# Patient Record
Sex: Female | Born: 1960 | Race: Black or African American | Hispanic: No | Marital: Married | Smoking: Never smoker
Health system: Southern US, Community
[De-identification: ages and names within clinical notes are randomized; demographics above are authoritative.]

## PROBLEM LIST (undated history)

## (undated) DIAGNOSIS — E785 Hyperlipidemia, unspecified: Secondary | ICD-10-CM

## (undated) DIAGNOSIS — I10 Essential (primary) hypertension: Secondary | ICD-10-CM

## (undated) DIAGNOSIS — J189 Pneumonia, unspecified organism: Secondary | ICD-10-CM

## (undated) DIAGNOSIS — Z87442 Personal history of urinary calculi: Secondary | ICD-10-CM

## (undated) HISTORY — DX: Hyperlipidemia, unspecified: E78.5

## (undated) HISTORY — DX: Essential (primary) hypertension: I10

---
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
## 2003-03-30 ENCOUNTER — Emergency Department (HOSPITAL_COMMUNITY): Admission: EM | Admit: 2003-03-30 | Discharge: 2003-03-30 | Payer: Self-pay | Admitting: Emergency Medicine

## 2003-09-18 ENCOUNTER — Encounter: Admission: RE | Admit: 2003-09-18 | Discharge: 2003-09-18 | Payer: Self-pay | Admitting: Internal Medicine

## 2003-10-07 ENCOUNTER — Encounter: Admission: RE | Admit: 2003-10-07 | Discharge: 2003-10-07 | Payer: Self-pay | Admitting: Internal Medicine

## 2004-05-02 ENCOUNTER — Encounter: Admission: RE | Admit: 2004-05-02 | Discharge: 2004-05-02 | Payer: Self-pay | Admitting: Internal Medicine

## 2005-10-25 ENCOUNTER — Encounter: Admission: RE | Admit: 2005-10-25 | Discharge: 2005-10-25 | Payer: Self-pay | Admitting: Internal Medicine

## 2008-03-23 ENCOUNTER — Encounter: Admission: RE | Admit: 2008-03-23 | Discharge: 2008-03-23 | Payer: Self-pay | Admitting: Internal Medicine

## 2008-11-04 ENCOUNTER — Encounter: Admission: RE | Admit: 2008-11-04 | Discharge: 2008-11-04 | Payer: Self-pay | Admitting: Internal Medicine

## 2009-03-30 ENCOUNTER — Encounter: Admission: RE | Admit: 2009-03-30 | Discharge: 2009-03-30 | Payer: Self-pay | Admitting: Internal Medicine

## 2010-03-31 ENCOUNTER — Encounter
Admission: RE | Admit: 2010-03-31 | Discharge: 2010-03-31 | Payer: Self-pay | Source: Home / Self Care | Attending: Internal Medicine | Admitting: Internal Medicine

## 2010-04-24 ENCOUNTER — Encounter: Payer: Self-pay | Admitting: Internal Medicine

## 2010-07-01 ENCOUNTER — Emergency Department (HOSPITAL_COMMUNITY): Payer: Managed Care, Other (non HMO)

## 2010-07-01 ENCOUNTER — Emergency Department (HOSPITAL_COMMUNITY)
Admission: EM | Admit: 2010-07-01 | Discharge: 2010-07-01 | Disposition: A | Discharge status: Home / Self Care | Payer: Managed Care, Other (non HMO) | Attending: Emergency Medicine | Admitting: Emergency Medicine

## 2010-07-01 DIAGNOSIS — S93409A Sprain of unspecified ligament of unspecified ankle, initial encounter: Secondary | ICD-10-CM | POA: Insufficient documentation

## 2010-07-01 DIAGNOSIS — Y9289 Other specified places as the place of occurrence of the external cause: Secondary | ICD-10-CM | POA: Insufficient documentation

## 2010-07-01 DIAGNOSIS — W101XXA Fall (on)(from) sidewalk curb, initial encounter: Secondary | ICD-10-CM | POA: Insufficient documentation

## 2010-08-03 ENCOUNTER — Other Ambulatory Visit: Payer: Self-pay | Admitting: Internal Medicine

## 2010-08-03 DIAGNOSIS — N644 Mastodynia: Secondary | ICD-10-CM

## 2010-08-04 ENCOUNTER — Other Ambulatory Visit: Payer: Self-pay | Admitting: Internal Medicine

## 2010-08-04 DIAGNOSIS — R102 Pelvic and perineal pain: Secondary | ICD-10-CM

## 2010-08-08 ENCOUNTER — Ambulatory Visit
Admission: RE | Admit: 2010-08-08 | Discharge: 2010-08-08 | Disposition: A | Discharge status: Home / Self Care | Payer: Managed Care, Other (non HMO) | Source: Ambulatory Visit | Attending: Internal Medicine | Admitting: Internal Medicine

## 2010-08-08 DIAGNOSIS — R102 Pelvic and perineal pain: Secondary | ICD-10-CM

## 2010-08-10 ENCOUNTER — Other Ambulatory Visit: Payer: Self-pay | Admitting: Internal Medicine

## 2010-08-10 ENCOUNTER — Ambulatory Visit
Admission: RE | Admit: 2010-08-10 | Discharge: 2010-08-10 | Disposition: A | Discharge status: Home / Self Care | Payer: Private Health Insurance - Indemnity | Source: Ambulatory Visit | Attending: Internal Medicine | Admitting: Internal Medicine

## 2010-08-10 DIAGNOSIS — N644 Mastodynia: Secondary | ICD-10-CM

## 2011-02-21 ENCOUNTER — Other Ambulatory Visit: Payer: Self-pay | Admitting: Internal Medicine

## 2011-02-21 DIAGNOSIS — R922 Inconclusive mammogram: Secondary | ICD-10-CM

## 2011-03-14 ENCOUNTER — Ambulatory Visit
Admission: RE | Admit: 2011-03-14 | Discharge: 2011-03-14 | Disposition: A | Discharge status: Home / Self Care | Payer: Private Health Insurance - Indemnity | Source: Ambulatory Visit | Attending: Internal Medicine | Admitting: Internal Medicine

## 2011-03-14 DIAGNOSIS — R922 Inconclusive mammogram: Secondary | ICD-10-CM

## 2011-07-17 ENCOUNTER — Other Ambulatory Visit: Payer: Self-pay | Admitting: Internal Medicine

## 2011-07-17 DIAGNOSIS — N6315 Unspecified lump in the right breast, overlapping quadrants: Secondary | ICD-10-CM

## 2011-07-17 DIAGNOSIS — N644 Mastodynia: Secondary | ICD-10-CM

## 2011-07-18 ENCOUNTER — Ambulatory Visit
Admission: RE | Admit: 2011-07-18 | Discharge: 2011-07-18 | Disposition: A | Discharge status: Home / Self Care | Payer: Private Health Insurance - Indemnity | Source: Ambulatory Visit | Attending: Internal Medicine | Admitting: Internal Medicine

## 2011-07-18 DIAGNOSIS — N6315 Unspecified lump in the right breast, overlapping quadrants: Secondary | ICD-10-CM

## 2011-07-18 DIAGNOSIS — N644 Mastodynia: Secondary | ICD-10-CM

## 2012-06-14 ENCOUNTER — Other Ambulatory Visit: Payer: Self-pay | Admitting: Internal Medicine

## 2012-06-14 DIAGNOSIS — D249 Benign neoplasm of unspecified breast: Secondary | ICD-10-CM

## 2012-06-14 DIAGNOSIS — N6310 Unspecified lump in the right breast, unspecified quadrant: Secondary | ICD-10-CM

## 2012-07-18 ENCOUNTER — Ambulatory Visit
Admission: RE | Admit: 2012-07-18 | Discharge: 2012-07-18 | Disposition: A | Discharge status: Home / Self Care | Payer: BC Managed Care – PPO | Source: Ambulatory Visit | Attending: Internal Medicine | Admitting: Internal Medicine

## 2012-07-18 DIAGNOSIS — D249 Benign neoplasm of unspecified breast: Secondary | ICD-10-CM

## 2012-07-18 DIAGNOSIS — N6310 Unspecified lump in the right breast, unspecified quadrant: Secondary | ICD-10-CM

## 2012-09-30 ENCOUNTER — Other Ambulatory Visit: Payer: Self-pay | Admitting: Nurse Practitioner

## 2012-09-30 ENCOUNTER — Ambulatory Visit
Admission: RE | Admit: 2012-09-30 | Discharge: 2012-09-30 | Disposition: A | Discharge status: Home / Self Care | Payer: BC Managed Care – PPO | Source: Ambulatory Visit | Attending: Nurse Practitioner | Admitting: Nurse Practitioner

## 2012-09-30 DIAGNOSIS — R0989 Other specified symptoms and signs involving the circulatory and respiratory systems: Secondary | ICD-10-CM

## 2012-09-30 DIAGNOSIS — R062 Wheezing: Secondary | ICD-10-CM

## 2013-06-17 ENCOUNTER — Other Ambulatory Visit: Payer: Self-pay

## 2013-06-17 DIAGNOSIS — Z1231 Encounter for screening mammogram for malignant neoplasm of breast: Secondary | ICD-10-CM

## 2013-07-29 ENCOUNTER — Ambulatory Visit
Admission: RE | Admit: 2013-07-29 | Discharge: 2013-07-29 | Disposition: A | Discharge status: Home / Self Care | Payer: BC Managed Care – PPO | Source: Ambulatory Visit

## 2013-07-29 ENCOUNTER — Encounter (INDEPENDENT_AMBULATORY_CARE_PROVIDER_SITE_OTHER): Payer: Self-pay

## 2013-07-29 DIAGNOSIS — Z1231 Encounter for screening mammogram for malignant neoplasm of breast: Secondary | ICD-10-CM

## 2014-08-17 ENCOUNTER — Other Ambulatory Visit: Payer: Self-pay | Admitting: Family

## 2014-08-17 ENCOUNTER — Ambulatory Visit
Admission: RE | Admit: 2014-08-17 | Discharge: 2014-08-17 | Disposition: A | Discharge status: Home / Self Care | Payer: BLUE CROSS/BLUE SHIELD | Source: Ambulatory Visit | Attending: Family | Admitting: Family

## 2014-08-17 DIAGNOSIS — M25562 Pain in left knee: Secondary | ICD-10-CM

## 2017-04-26 DIAGNOSIS — Z1389 Encounter for screening for other disorder: Secondary | ICD-10-CM | POA: Diagnosis not present

## 2017-04-26 DIAGNOSIS — Z01419 Encounter for gynecological examination (general) (routine) without abnormal findings: Secondary | ICD-10-CM | POA: Diagnosis not present

## 2017-04-26 DIAGNOSIS — Z1231 Encounter for screening mammogram for malignant neoplasm of breast: Secondary | ICD-10-CM | POA: Diagnosis not present

## 2017-04-26 DIAGNOSIS — N951 Menopausal and female climacteric states: Secondary | ICD-10-CM | POA: Diagnosis not present

## 2017-04-26 DIAGNOSIS — R8290 Unspecified abnormal findings in urine: Secondary | ICD-10-CM | POA: Diagnosis not present

## 2017-04-26 DIAGNOSIS — Z124 Encounter for screening for malignant neoplasm of cervix: Secondary | ICD-10-CM | POA: Diagnosis not present

## 2017-04-26 DIAGNOSIS — Z6827 Body mass index (BMI) 27.0-27.9, adult: Secondary | ICD-10-CM | POA: Diagnosis not present

## 2017-04-26 DIAGNOSIS — Z1151 Encounter for screening for human papillomavirus (HPV): Secondary | ICD-10-CM | POA: Diagnosis not present

## 2017-04-26 DIAGNOSIS — Z130 Encounter for screening for diseases of the blood and blood-forming organs and certain disorders involving the immune mechanism: Secondary | ICD-10-CM | POA: Diagnosis not present

## 2017-06-18 DIAGNOSIS — I10 Essential (primary) hypertension: Secondary | ICD-10-CM | POA: Diagnosis not present

## 2017-06-18 DIAGNOSIS — R3121 Asymptomatic microscopic hematuria: Secondary | ICD-10-CM | POA: Diagnosis not present

## 2017-06-18 DIAGNOSIS — Z0000 Encounter for general adult medical examination without abnormal findings: Secondary | ICD-10-CM | POA: Diagnosis not present

## 2017-06-26 DIAGNOSIS — Z0000 Encounter for general adult medical examination without abnormal findings: Secondary | ICD-10-CM | POA: Diagnosis not present

## 2017-12-17 DIAGNOSIS — N182 Chronic kidney disease, stage 2 (mild): Secondary | ICD-10-CM | POA: Diagnosis not present

## 2017-12-17 DIAGNOSIS — I129 Hypertensive chronic kidney disease with stage 1 through stage 4 chronic kidney disease, or unspecified chronic kidney disease: Secondary | ICD-10-CM | POA: Diagnosis not present

## 2017-12-17 DIAGNOSIS — Z79899 Other long term (current) drug therapy: Secondary | ICD-10-CM | POA: Diagnosis not present

## 2017-12-17 DIAGNOSIS — Z6828 Body mass index (BMI) 28.0-28.9, adult: Secondary | ICD-10-CM | POA: Diagnosis not present

## 2018-05-08 DIAGNOSIS — Z1211 Encounter for screening for malignant neoplasm of colon: Secondary | ICD-10-CM | POA: Diagnosis not present

## 2018-05-13 ENCOUNTER — Encounter: Payer: Self-pay | Admitting: Internal Medicine

## 2018-05-13 DIAGNOSIS — Z1389 Encounter for screening for other disorder: Secondary | ICD-10-CM | POA: Diagnosis not present

## 2018-05-13 DIAGNOSIS — Z130 Encounter for screening for diseases of the blood and blood-forming organs and certain disorders involving the immune mechanism: Secondary | ICD-10-CM | POA: Diagnosis not present

## 2018-05-13 DIAGNOSIS — Z01419 Encounter for gynecological examination (general) (routine) without abnormal findings: Secondary | ICD-10-CM | POA: Diagnosis not present

## 2018-05-13 DIAGNOSIS — Z6827 Body mass index (BMI) 27.0-27.9, adult: Secondary | ICD-10-CM | POA: Diagnosis not present

## 2018-05-13 DIAGNOSIS — Z1231 Encounter for screening mammogram for malignant neoplasm of breast: Secondary | ICD-10-CM | POA: Diagnosis not present

## 2018-05-15 DIAGNOSIS — Z1211 Encounter for screening for malignant neoplasm of colon: Secondary | ICD-10-CM | POA: Diagnosis not present

## 2018-07-22 ENCOUNTER — Encounter: Payer: Self-pay | Admitting: Internal Medicine

## 2018-07-22 ENCOUNTER — Ambulatory Visit: Payer: BLUE CROSS/BLUE SHIELD | Admitting: Internal Medicine

## 2018-07-22 ENCOUNTER — Other Ambulatory Visit: Payer: Self-pay

## 2018-07-22 VITALS — BP 116/74 | HR 63 | Temp 98.6°F | Ht 62.8 in | Wt 163.4 lb

## 2018-07-22 DIAGNOSIS — Z0000 Encounter for general adult medical examination without abnormal findings: Secondary | ICD-10-CM | POA: Diagnosis not present

## 2018-07-22 DIAGNOSIS — Z23 Encounter for immunization: Secondary | ICD-10-CM | POA: Diagnosis not present

## 2018-07-22 DIAGNOSIS — I10 Essential (primary) hypertension: Secondary | ICD-10-CM

## 2018-07-22 LAB — POCT URINALYSIS DIPSTICK
Bilirubin, UA: NEGATIVE
Glucose, UA: NEGATIVE
Ketones, UA: NEGATIVE
Nitrite, UA: NEGATIVE
Protein, UA: NEGATIVE
Spec Grav, UA: 1.01 (ref 1.010–1.025)
Urobilinogen, UA: 0.2 E.U./dL
pH, UA: 7 (ref 5.0–8.0)

## 2018-07-22 LAB — POCT UA - MICROALBUMIN
Creatinine, POC: 50 mg/dL
Microalbumin Ur, POC: 10 mg/L

## 2018-07-22 MED ORDER — TETANUS-DIPHTH-ACELL PERTUSSIS 5-2.5-18.5 LF-MCG/0.5 IM SUSP
0.5000 mL | Freq: Once | INTRAMUSCULAR | Status: AC
  Administered 2018-07-22: 0.5 mL via INTRAMUSCULAR

## 2018-07-22 NOTE — Progress Notes (Signed)
Subjective:     Patient ID: Victoria Strickland , female    DOB: [birth year] , 59 y.o.   MRN: [id number]   Chief Complaint  Patient presents with  . Annual Exam  . Hypertension    HPI  She is here today for a full physical examination. She is followed by GYN for her pelvic exams.  She reports she was last seen by Dr. Marvel Plan in Feb 2020.  She does not have any specific concerns at this time.   Hypertension  This is a chronic problem. The current episode started more than 1 year ago. The problem has been gradually improving since onset. The problem is controlled. Pertinent negatives include no blurred vision, chest pain, palpitations or shortness of breath. Risk factors for coronary artery disease include post-menopausal state. There is no history of kidney disease or CAD/MI.     History reviewed. No pertinent past medical history.   Family History  Problem Relation Age of Onset  . Fibroids Mother   . Cancer Mother   . Aneurysm Father      Current Outpatient Medications:  .  albuterol (PROAIR HFA) 108 (90 Base) MCG/ACT inhaler, ProAir HFA 90 mcg/actuation aerosol inhaler  inhale 2 puffs by mouth every 4 to 6 hours if needed, Disp: , Rfl:  .  lisinopril (ZESTRIL) 5 MG tablet, TK 1 T PO QD, Disp: , Rfl:   Current Facility-Administered Medications:  .  Tdap (BOOSTRIX) injection 0.5 mL, 0.5 mL, Intramuscular, Once, [place]ale Chard, MD   No Known Allergies    The patient states she uses post menopausal status for birth control. Last LMP was No LMP recorded. Patient is postmenopausal.. Negative for Dysmenorrhea Negative for: breast discharge, breast lump(s), breast pain and breast self exam. Associated symptoms include abnormal vaginal bleeding. Pertinent negatives include abnormal bleeding (hematology), anxiety, decreased libido, depression, difficulty falling sleep, dyspareunia, history of infertility, nocturia, sexual dysfunction, sleep disturbances, urinary incontinence, urinary  urgency, vaginal discharge and vaginal itching. Diet regular.The patient states her exercise level is  moderate.  . The patient's tobacco use is:  Social History   Tobacco Use  Smoking Status Never Smoker  Smokeless Tobacco Never Used  . She has been exposed to passive smoke. The patient's alcohol use is:  Social History   Substance and Sexual Activity  Alcohol Use Never  . Frequency: Never  . Additional information: Last pap 05/13/18, next one scheduled for 2021.   Review of Systems  Constitutional: Negative.   HENT: Negative.   Eyes: Negative.  Negative for blurred vision.  Respiratory: Negative.  Negative for shortness of breath.   Cardiovascular: Negative.  Negative for chest pain and palpitations.  Gastrointestinal: Negative.   Endocrine: Negative.   Genitourinary: Negative.   Musculoskeletal: Negative.   Skin: Negative.   Allergic/Immunologic: Negative.   Neurological: Negative.   Hematological: Negative.   Psychiatric/Behavioral: Negative.      Today's Vitals   07/22/18 1032  BP: 116/74  Pulse: 63  Temp: 98.6 F (37 C)  TempSrc: Oral  Weight: 163 lb 6.4 oz (74.1 kg)  Height: 5' 2.8" (1.595 m)   Body mass index is 29.13 kg/m.   Objective:  Physical Exam Vitals signs and nursing note reviewed.  Constitutional:      Appearance: Normal appearance.  HENT:     Head: Normocephalic and atraumatic.     Right Ear: Tympanic membrane, ear canal and external ear normal.     Left Ear: Tympanic membrane, ear canal and external ear  normal.     Nose: Nose normal.     Mouth/Throat:     Mouth: Mucous membranes are moist.     Pharynx: Oropharynx is clear.  Eyes:     Extraocular Movements: Extraocular movements intact.     Conjunctiva/sclera: Conjunctivae normal.     Pupils: Pupils are equal, round, and reactive to light.  Neck:     Musculoskeletal: Normal range of motion and neck supple.  Cardiovascular:     Rate and Rhythm: Normal rate and regular rhythm.      Pulses: Normal pulses.     Heart sounds: Normal heart sounds.  Pulmonary:     Effort: Pulmonary effort is normal.     Breath sounds: Normal breath sounds.  Chest:     Breasts:        Right: Normal. No swelling, bleeding, inverted nipple, mass or nipple discharge.        Left: Normal. No swelling, bleeding, inverted nipple, mass or nipple discharge.  Abdominal:     General: Abdomen is flat. Bowel sounds are normal.     Palpations: Abdomen is soft.  Genitourinary:    Comments: deferred Musculoskeletal: Normal range of motion.  Skin:    General: Skin is warm and dry.  Neurological:     General: No focal deficit present.     Mental Status: She is alert and oriented to person, place, and time.  Psychiatric:        Mood and Affect: Mood normal.        Behavior: Behavior normal.         Assessment And Plan:     1. Routine general medical examination at health care facility  A full exam was performed.  Importance of monthly self breast exams was discussed with the patient.  PATIENT HAS BEEN ADVISED TO GET 30-45 MINUTES REGULAR EXERCISE NO LESS THAN FOUR TO FIVE DAYS PER WEEK - BOTH WEIGHTBEARING EXERCISES AND AEROBIC ARE RECOMMENDED.  SHE IS ADVISED TO FOLLOW A HEALTHY DIET WITH AT LEAST SIX FRUITS/VEGGIES PER DAY, DECREASE INTAKE OF RED MEAT, AND TO INCREASE FISH INTAKE TO TWO DAYS PER WEEK.  MEATS/FISH SHOULD NOT BE FRIED, BAKED OR BROILED IS PREFERABLE.  I SUGGEST WEARING SPF 50 SUNSCREEN ON EXPOSED PARTS AND ESPECIALLY WHEN IN THE DIRECT SUNLIGHT FOR AN EXTENDED PERIOD OF TIME.  PLEASE AVOID FAST FOOD RESTAURANTS AND INCREASE YOUR WATER INTAKE.  - CMP14+EGFR - CBC - Lipid panel - Hemoglobin A1c - TSH  2. Essential hypertension, benign  Well controlled. She will continue with current meds. She is encouraged to avoid adding salt to her foods. She will rto in six months for re-evaluation. EKG performed, no acute changes noted.   3. Need for vaccination  - Tdap (BOOSTRIX)  injection 0.5 mL        Maximino Greenland, MD    THE PATIENT IS ENCOURAGED TO PRACTICE SOCIAL DISTANCING DUE TO THE COVID-19 PANDEMIC.

## 2018-07-22 NOTE — Patient Instructions (Signed)

## 2018-07-23 LAB — CMP14+EGFR
ALT: 24 IU/L (ref 0–32)
AST: 24 IU/L (ref 0–40)
Albumin/Globulin Ratio: 1.7 (ref 1.2–2.2)
Albumin: 4.5 g/dL (ref 3.8–4.9)
Alkaline Phosphatase: 61 IU/L (ref 39–117)
BUN/Creatinine Ratio: 11 (ref 9–23)
BUN: 10 mg/dL (ref 6–24)
Bilirubin Total: 0.4 mg/dL (ref 0.0–1.2)
CO2: 25 mmol/L (ref 20–29)
Calcium: 9.7 mg/dL (ref 8.7–10.2)
Chloride: 101 mmol/L (ref 96–106)
Creatinine, Ser: 0.93 mg/dL (ref 0.57–1.00)
GFR calc Af Amer: 78 mL/min/{1.73_m2} (ref >59)
GFR calc non Af Amer: 67 mL/min/{1.73_m2} (ref >59)
Globulin, Total: 2.7 g/dL (ref 1.5–4.5)
Glucose: 71 mg/dL (ref 65–99)
Potassium: 4.4 mmol/L (ref 3.5–5.2)
Sodium: 140 mmol/L (ref 134–144)
Total Protein: 7.2 g/dL (ref 6.0–8.5)

## 2018-07-23 LAB — HEMOGLOBIN A1C
Est. average glucose Bld gHb Est-mCnc: 111 mg/dL
Hgb A1c MFr Bld: 5.5 % (ref 4.8–5.6)

## 2018-07-23 LAB — CBC
Hematocrit: 36.7 % (ref 34.0–46.6)
Hemoglobin: 11.6 g/dL (ref 11.1–15.9)
MCH: 25.8 pg — ABNORMAL LOW (ref 26.6–33.0)
MCHC: 31.6 g/dL (ref 31.5–35.7)
MCV: 82 fL (ref 79–97)
Platelets: 166 10*3/uL (ref 150–450)
RBC: 4.5 x10E6/uL (ref 3.77–5.28)
RDW: 13.1 % (ref 11.7–15.4)
WBC: 3.5 10*3/uL (ref 3.4–10.8)

## 2018-07-23 LAB — LIPID PANEL
Chol/HDL Ratio: 5.1 ratio — ABNORMAL HIGH (ref 0.0–4.4)
Cholesterol, Total: 259 mg/dL — ABNORMAL HIGH (ref 100–199)
HDL: 51 mg/dL (ref >39)
LDL Calculated: 181 mg/dL — ABNORMAL HIGH (ref 0–99)
Triglycerides: 135 mg/dL (ref 0–149)
VLDL Cholesterol Cal: 27 mg/dL (ref 5–40)

## 2018-07-23 LAB — TSH: TSH: 2.04 u[IU]/mL (ref 0.450–4.500)

## 2018-07-24 ENCOUNTER — Encounter: Payer: Self-pay | Admitting: Internal Medicine

## 2018-07-26 ENCOUNTER — Other Ambulatory Visit: Payer: Self-pay

## 2018-07-26 MED ORDER — ROSUVASTATIN CALCIUM 10 MG PO TABS: 10.0000 mg | ORAL_TABLET | Freq: Every day | ORAL | 1 refills | Status: DC

## 2018-07-30 ENCOUNTER — Other Ambulatory Visit: Payer: Self-pay

## 2018-09-16 ENCOUNTER — Other Ambulatory Visit: Payer: Self-pay

## 2018-09-16 ENCOUNTER — Ambulatory Visit (INDEPENDENT_AMBULATORY_CARE_PROVIDER_SITE_OTHER): Payer: BC Managed Care – PPO | Admitting: Internal Medicine

## 2018-09-16 ENCOUNTER — Encounter: Payer: Self-pay | Admitting: Internal Medicine

## 2018-09-16 VITALS — BP 128/96 | HR 61 | Temp 97.9°F | Ht 62.8 in | Wt 163.6 lb

## 2018-09-16 DIAGNOSIS — E7800 Pure hypercholesterolemia, unspecified: Secondary | ICD-10-CM

## 2018-09-16 DIAGNOSIS — I10 Essential (primary) hypertension: Secondary | ICD-10-CM

## 2018-09-16 MED ORDER — LISINOPRIL 5 MG PO TABS: ORAL_TABLET | ORAL | 1 refills | Status: DC

## 2018-09-16 NOTE — Patient Instructions (Signed)

## 2018-09-16 NOTE — Progress Notes (Signed)
  Subjective:     Patient ID: Victoria Strickland , female    DOB: 08-17-1960 , 58 y.o.   MRN: [id number]   Chief Complaint  Patient presents with  . Hyperlipidemia    HPI  She is here today for a cholesterol check. She was started on rosuvastatin at her last visit. Although she agreed to starting lipid-lowering therapy, she never started the medication. She did start CoQ10 in hopes that it would help to lower her cholesterol . She does not wish to take rx medication.     History reviewed. No pertinent past medical history.   Family History  Problem Relation Age of Onset  . Fibroids Mother   . Cancer Mother   . Aneurysm Father      Current Outpatient Medications:  .  Coenzyme Q10 ([facility] COQ-10 PO), Take 100 mg by mouth daily. Liquid form, Disp: , Rfl:  .  lisinopril (ZESTRIL) 5 MG tablet, TK 1 T PO QD, Disp: 90 tablet, Rfl: 1 .  albuterol (PROAIR HFA) 108 (90 Base) MCG/ACT inhaler, ProAir HFA 90 mcg/actuation aerosol inhaler  inhale 2 puffs by mouth every 4 to 6 hours if needed, Disp: , Rfl:    No Known Allergies   Review of Systems  Constitutional: Negative.   Respiratory: Negative.   Cardiovascular: Negative.   Gastrointestinal: Negative.   Neurological: Negative.   Psychiatric/Behavioral: Negative.      Today's Vitals   09/16/18 1016  BP: (!) 128/96  Pulse: 61  Temp: 97.9 F (36.6 C)  TempSrc: Oral  Weight: 163 lb 9.6 oz (74.2 kg)  Height: 5' 2.8" (1.595 m)   Body mass index is 29.17 kg/m.   Objective:  Physical Exam Vitals signs and nursing note reviewed.  Constitutional:      Appearance: Normal appearance.  HENT:     Head: Normocephalic and atraumatic.  Cardiovascular:     Rate and Rhythm: Normal rate and regular rhythm.     Heart sounds: Normal heart sounds.  Pulmonary:     Effort: Pulmonary effort is normal.     Breath sounds: Normal breath sounds.  Skin:    General: Skin is warm.  Neurological:     General: No focal deficit present.     Mental  Status: She is alert.  Psychiatric:        Mood and Affect: Mood normal.        Behavior: Behavior normal.         Assessment And Plan:     1. Pure hypercholesterolemia  Since she did not start the rosuvastatin, I did not see the need to recheck her cholesterol. I will recheck at her next visit in four months. Pt advised to try berberine as directed since she is unwilling to start statin therapy. She agrees to current treatment plan. She is encouraged to avoid fried foods, exercise no less than five days weekly and to increase her fish intake.   2. Essential hypertension, benign  Fair control.  She will continue with current meds. I will reassess at her next visit.   Maximino Greenland, MD    THE PATIENT IS ENCOURAGED TO PRACTICE SOCIAL DISTANCING DUE TO THE COVID-19 PANDEMIC.

## 2019-01-21 ENCOUNTER — Other Ambulatory Visit: Payer: Self-pay

## 2019-01-21 ENCOUNTER — Encounter: Payer: Self-pay | Admitting: Internal Medicine

## 2019-01-21 ENCOUNTER — Ambulatory Visit (INDEPENDENT_AMBULATORY_CARE_PROVIDER_SITE_OTHER): Payer: BC Managed Care – PPO | Admitting: Internal Medicine

## 2019-01-21 VITALS — BP 126/88 | HR 56 | Temp 98.7°F | Ht 62.8 in | Wt 158.4 lb

## 2019-01-21 DIAGNOSIS — I10 Essential (primary) hypertension: Secondary | ICD-10-CM | POA: Diagnosis not present

## 2019-01-21 DIAGNOSIS — Z23 Encounter for immunization: Secondary | ICD-10-CM | POA: Diagnosis not present

## 2019-01-21 DIAGNOSIS — Z6828 Body mass index (BMI) 28.0-28.9, adult: Secondary | ICD-10-CM | POA: Diagnosis not present

## 2019-01-21 DIAGNOSIS — E7800 Pure hypercholesterolemia, unspecified: Secondary | ICD-10-CM | POA: Diagnosis not present

## 2019-01-21 DIAGNOSIS — E663 Overweight: Secondary | ICD-10-CM | POA: Diagnosis not present

## 2019-01-21 MED ORDER — LISINOPRIL 5 MG PO TABS: ORAL_TABLET | ORAL | 1 refills | Status: DC

## 2019-01-21 NOTE — Progress Notes (Signed)
Subjective:     Patient ID: Victoria Strickland , female    DOB: 06-11-1960 , 58 y.o.   MRN: [id number]   Chief Complaint  Patient presents with  . Hyperlipidemia    HPI  She is here today for a cholesterol check. She does not wish to take any cholesterol medication. She reports she has been exercising more frequently in an effort to improve her numbers. Unfortunately, (in her opinion) she lost weight. She is concerned about the weight loss and wants to know what to do to prevent further weight gain.   Hyperlipidemia This is a chronic problem. The current episode started more than 1 year ago. The problem is uncontrolled. Recent lipid tests were reviewed and are high. Pertinent negatives include no focal weakness, leg pain or myalgias. Current antihyperlipidemic treatment includes diet change and exercise.     Past Medical History:  Diagnosis Date  . Essential hypertension, benign   . Hyperlipidemia      Family History  Problem Relation Age of Onset  . Fibroids Mother   . Cancer Mother   . Aneurysm Father      Current Outpatient Medications:  .  Barberry-Oreg Grape-Goldenseal (BERBERINE COMPLEX PO), Take 1,000 mg by mouth. 1 per day, Disp: , Rfl:  .  lisinopril (ZESTRIL) 5 MG tablet, TK 1 T PO QD, Disp: 90 tablet, Rfl: 1 .  albuterol (PROAIR HFA) 108 (90 Base) MCG/ACT inhaler, ProAir HFA 90 mcg/actuation aerosol inhaler  inhale 2 puffs by mouth every 4 to 6 hours if needed, Disp: , Rfl:  .  Coenzyme Q10 ([facility]S COQ-10 PO), Take 100 mg by mouth daily. Liquid form, Disp: , Rfl:    No Known Allergies   Review of Systems  Constitutional: Negative.   Respiratory: Negative.   Cardiovascular: Negative.   Gastrointestinal: Negative.   Musculoskeletal: Negative for myalgias.  Neurological: Negative.  Negative for focal weakness.  Psychiatric/Behavioral: Negative.      Today's Vitals   01/21/19 1105  BP: 126/88  Pulse: (!) 56  Temp: 98.7 F (37.1 C)  TempSrc: Oral  Weight:  158 lb 6.4 oz (71.8 kg)  Height: 5' 2.8" (1.595 m)   Body mass index is 28.24 kg/m.   Objective:  Physical Exam Vitals signs and nursing note reviewed.  Constitutional:      Appearance: Normal appearance.  HENT:     Head: Normocephalic and atraumatic.  Cardiovascular:     Rate and Rhythm: Normal rate and regular rhythm.     Heart sounds: Normal heart sounds.  Pulmonary:     Effort: Pulmonary effort is normal.     Breath sounds: Normal breath sounds.  Skin:    General: Skin is warm.  Neurological:     General: No focal deficit present.     Mental Status: She is alert.  Psychiatric:        Mood and Affect: Mood normal.        Behavior: Behavior normal.         Assessment And Plan:     1. Pure hypercholesterolemia  Chronic. I will refer her to Nutrition for further dietary counseling. She is encouraged to incorporate more fiber into her daily routine. She agrees to NMR lipoprofile testing. She is interested in transitioning to a vegetarian/vegan lifestyle. She will speak to Nutrition further about this. She is willing to consider berberine supplementation in the future if needed. Again, she is not willing to take any rx medication for her condition. Greater than 50%  of face to face time was spent in counseling and coordination of care. All questions were answered to her satisfaction. I spent 25 minutes with the patient.   - Referral to Nutrition [facility] Diabetes Services - NMR LipoProf + Graph  2. Essential hypertension, benign  Chronic. She will continue with current meds. She is encouraged to avoid adding salt to her foods. Per her request, will check her blood type.   - Referral to Nutrition and Diabetes Services - ABO AND RH   3. Flu vaccine need  She was given flu vaccine to update her immunization history.    4. Overweight with body mass index (BMI) of 28 to 28.9 in adult  She has lost five pounds since June 2020.  Pt advised she is now at a healthier weight. She  reports she wants to return to her previous weight. Importance of achieving optimal weight to decrease risk of cardiovascular disease and cancers was discussed with the patient in full detail. She will discuss this further with Nutrition.   Maximino Greenland, MD    THE PATIENT IS ENCOURAGED TO PRACTICE SOCIAL DISTANCING DUE TO THE COVID-19 PANDEMIC.

## 2019-01-21 NOTE — Patient Instructions (Signed)
Leaky gut - Dr. Merrily [place]   Blood Typing Test Why am I having this test? Blood typing is a blood test that is done to determine your blood type. Knowing your blood type is important because most blood types do not mix (they are incompatible). You may have a blood typing test if:  You need to receive donated blood (blood transfusion). If you receive blood that is incompatible with yours during a transfusion, your body's disease-fighting system (immune system) will try to destroy the new blood. This could make you very sick.  You are planning to have a baby, or you or your partner becomes pregnant. Blood typing is important during pregnancy because there is a risk that the mother's immune system will attack the unborn baby's blood (Rh incompatibility).  You are having major surgery. This test may also be performed on newborns who have symptoms of hemolytic disease of the newborn (HDN). HDN occurs when the baby's red blood cells are destroyed (hemolysis) because of a difference in blood type or Rh factor. It can lead to low amounts of red blood cells (anemia) and other problems. Symptoms at birth may include pale or yellow skin tone or difficulty breathing. What is being tested? This test checks for substances found on red blood cells (antigens). Specifically, the test checks for A, B, and Rh factor antigens. If you are preparing for a blood transfusion, you may have more tests to check for other antigens on your blood cells. This is to help reduce the chances of a reaction during the transfusion. What kind of sample is taken?     A blood sample is required for this test. It is usually collected by inserting a needle into a blood vessel. In newborns, a blood sample may be collected from the heel using a small needle (heel stick). Tell a health care provider about:  Any allergies you have.  All medicines you are taking, including vitamins, herbs, eye drops, creams, and over-the-counter medicines.   Any blood disorders you have.  Any surgeries you have had.  Any medical conditions you have.  Whether you are pregnant or may be pregnant. How are the results reported? Your results will be reported as your blood type, as well as either positive or negative for Rh factor. There are eight blood types:  A positive.  A negative.  B positive.  B negative.  AB positive.  AB negative.  O positive.  O negative. What do the results mean? Your blood belongs to the A group if you have A antigens, the B group if you have B antigens, the AB group if you have A and B antigens, and the O group if you have neither A nor B antigens. If your blood has the Rh factor antigen, your blood type is "Rh positive." If it does not, it is "Rh negative."  During pregnancy, if you are Rh negative and your baby is Rh positive, your body could produce antibodies to the Rh factor. This could be dangerous to your baby's health and may require treatment. People with O negative blood are universal donors. This means that their blood can be used for blood transfusions for any person, regardless of blood type. People with AB positive blood are universal recipients. This means that they can receive any type of blood during a blood transfusion. Talk with your health care provider about what your results mean. Questions to ask your health care provider Ask your health care provider, or the department that  is doing the test:  When will my results be ready?  How will I get my results?  What are my treatment options?  What other tests do I need?  What are my next steps? Summary  Blood typing is a blood test that is done to determine your blood type.  This test checks for substances found on red blood cells (antigens). Specifically, the test checks for A, B, and Rh factor antigens.  Your results will be reported as your blood type (A, B, AB, or O), as well as either positive or negative for Rh factor. This  information is not intended to replace advice given to you by your health care provider. Make sure you discuss any questions you have with your health care provider. Document Released: 12/10/2001 Document Revised: 01/11/2017 Document Reviewed: 01/11/2017 Elsevier Patient Education  2020 [employer].

## 2019-01-22 LAB — ABO AND RH: Rh Factor: POSITIVE

## 2019-01-22 LAB — NMR LIPOPROF + GRAPH
Cholesterol, Total: 285 mg/dL — ABNORMAL HIGH (ref 100–199)
HDL Particle Number: 26.8 umol/L — ABNORMAL LOW (ref >=30.5)
HDL-C: 46 mg/dL (ref >39)
LDL Particle Number: 2847 nmol/L — ABNORMAL HIGH (ref <1000)
LDL Size: 20.8 nm (ref >20.5)
LDL-C (NIH Calc): 207 mg/dL — ABNORMAL HIGH (ref 0–99)
LP-IR Score: 59 — ABNORMAL HIGH (ref <=45)
Small LDL Particle Number: 1428 nmol/L — ABNORMAL HIGH (ref <=527)
Triglycerides: 168 mg/dL — ABNORMAL HIGH (ref 0–149)

## 2019-01-23 ENCOUNTER — Encounter: Payer: Self-pay | Admitting: Internal Medicine

## 2019-01-28 DIAGNOSIS — H43811 Vitreous degeneration, right eye: Secondary | ICD-10-CM | POA: Diagnosis not present

## 2019-01-28 DIAGNOSIS — H2513 Age-related nuclear cataract, bilateral: Secondary | ICD-10-CM | POA: Diagnosis not present

## 2019-03-08 ENCOUNTER — Encounter: Payer: Self-pay | Admitting: Internal Medicine

## 2019-03-10 ENCOUNTER — Other Ambulatory Visit: Payer: Self-pay

## 2019-03-10 MED ORDER — LISINOPRIL 5 MG PO TABS: ORAL_TABLET | ORAL | 1 refills | Status: DC

## 2019-07-28 ENCOUNTER — Other Ambulatory Visit: Payer: Self-pay

## 2019-07-28 ENCOUNTER — Ambulatory Visit (INDEPENDENT_AMBULATORY_CARE_PROVIDER_SITE_OTHER): Payer: BC Managed Care – PPO | Admitting: Internal Medicine

## 2019-07-28 ENCOUNTER — Encounter: Payer: Self-pay | Admitting: Internal Medicine

## 2019-07-28 VITALS — BP 110/68 | HR 64 | Temp 98.2°F | Ht 63.0 in | Wt 157.4 lb

## 2019-07-28 DIAGNOSIS — M79671 Pain in right foot: Secondary | ICD-10-CM

## 2019-07-28 DIAGNOSIS — Z0000 Encounter for general adult medical examination without abnormal findings: Secondary | ICD-10-CM | POA: Diagnosis not present

## 2019-07-28 DIAGNOSIS — I10 Essential (primary) hypertension: Secondary | ICD-10-CM

## 2019-07-28 DIAGNOSIS — M79672 Pain in left foot: Secondary | ICD-10-CM

## 2019-07-28 LAB — POCT URINALYSIS DIPSTICK
Bilirubin, UA: NEGATIVE
Glucose, UA: NEGATIVE
Ketones, UA: NEGATIVE
Leukocytes, UA: NEGATIVE
Nitrite, UA: NEGATIVE
Protein, UA: NEGATIVE
Spec Grav, UA: 1.025 (ref 1.010–1.025)
Urobilinogen, UA: 0.2 E.U./dL
pH, UA: 6 (ref 5.0–8.0)

## 2019-07-28 LAB — POCT UA - MICROALBUMIN
Albumin/Creatinine Ratio, Urine, POC: <30
Creatinine, POC: 200 mg/dL
Microalbumin Ur, POC: 30 mg/L

## 2019-07-28 MED ORDER — LISINOPRIL 5 MG PO TABS: ORAL_TABLET | ORAL | 2 refills | Status: DC

## 2019-07-28 NOTE — Patient Instructions (Signed)
Health Maintenance, Female Adopting a healthy lifestyle and getting preventive care are important in promoting health and wellness. Ask your health care provider about:  The right schedule for you to have regular tests and exams.  Things you can do on your own to prevent diseases and keep yourself healthy. What should I know about diet, weight, and exercise? Eat a healthy diet   Eat a diet that includes plenty of vegetables, fruits, low-fat dairy products, and lean protein.  Do not eat a lot of foods that are high in solid fats, added sugars, or sodium. Maintain a healthy weight Body mass index (BMI) is used to identify weight problems. It estimates body fat based on height and weight. Your health care provider can help determine your BMI and help you achieve or maintain a healthy weight. Get regular exercise Get regular exercise. This is one of the most important things you can do for your health. Most adults should:  Exercise for at least 150 minutes each week. The exercise should increase your heart rate and make you sweat (moderate-intensity exercise).  Do strengthening exercises at least twice a week. This is in addition to the moderate-intensity exercise.  Spend less time sitting. Even light physical activity can be beneficial. Watch cholesterol and blood lipids Have your blood tested for lipids and cholesterol at 60 years of age, then have this test every 5 years. Have your cholesterol levels checked more often if:  Your lipid or cholesterol levels are high.  You are older than 60 years of age.  You are at high risk for heart disease. What should I know about cancer screening? Depending on your health history and family history, you may need to have cancer screening at various ages. This may include screening for:  Breast cancer.  Cervical cancer.  Colorectal cancer.  Skin cancer.  Lung cancer. What should I know about heart disease, diabetes, and high blood  pressure? Blood pressure and heart disease  High blood pressure causes heart disease and increases the risk of stroke. This is more likely to develop in people who have high blood pressure readings, are of African descent, or are overweight.  Have your blood pressure checked: ? Every 3-5 years if you are 18-39 years of age. ? Every year if you are 40 years old or older. Diabetes Have regular diabetes screenings. This checks your fasting blood sugar level. Have the screening done:  Once every three years after age 40 if you are at a normal weight and have a low risk for diabetes.  More often and at a younger age if you are overweight or have a high risk for diabetes. What should I know about preventing infection? Hepatitis B If you have a higher risk for hepatitis B, you should be screened for this virus. Talk with your health care provider to find out if you are at risk for hepatitis B infection. Hepatitis C Testing is recommended for:  Everyone born from 1945 through 1965.  Anyone with known risk factors for hepatitis C. Sexually transmitted infections (STIs)  Get screened for STIs, including gonorrhea and chlamydia, if: ? You are sexually active and are younger than 60 years of age. ? You are older than 60 years of age and your health care provider tells you that you are at risk for this type of infection. ? Your sexual activity has changed since you were last screened, and you are at increased risk for chlamydia or gonorrhea. Ask your health care provider if   you are at risk.  Ask your health care provider about whether you are at high risk for HIV. Your health care provider may recommend a prescription medicine to help prevent HIV infection. If you choose to take medicine to prevent HIV, you should first get tested for HIV. You should then be tested every 3 months for as long as you are taking the medicine. Pregnancy  If you are about to stop having your period (premenopausal) and  you may become pregnant, seek counseling before you get pregnant.  Take 400 to 800 micrograms (mcg) of folic acid every day if you become pregnant.  Ask for birth control (contraception) if you want to prevent pregnancy. Osteoporosis and menopause Osteoporosis is a disease in which the bones lose minerals and strength with aging. This can result in bone fractures. If you are 65 years old or older, or if you are at risk for osteoporosis and fractures, ask your health care provider if you should:  Be screened for bone loss.  Take a calcium or vitamin D supplement to lower your risk of fractures.  Be given hormone replacement therapy (HRT) to treat symptoms of menopause. Follow these instructions at home: Lifestyle  Do not use any products that contain nicotine or tobacco, such as cigarettes, e-cigarettes, and chewing tobacco. If you need help quitting, ask your health care provider.  Do not use street drugs.  Do not share needles.  Ask your health care provider for help if you need support or information about quitting drugs. Alcohol use  Do not drink alcohol if: ? Your health care provider tells you not to drink. ? You are pregnant, may be pregnant, or are planning to become pregnant.  If you drink alcohol: ? Limit how much you use to 0-1 drink a day. ? Limit intake if you are breastfeeding.  Be aware of how much alcohol is in your drink. In the [place] one drink equals one 12 oz bottle of beer (355 mL), one 5 oz glass of wine (148 mL), or one 1 oz glass of hard liquor (44 mL). General instructions  Schedule regular health, dental, and eye exams.  Stay current with your vaccines.  Tell your health care provider if: ? You often feel depressed. ? You have ever been abused or do not feel safe at home. Summary  Adopting a healthy lifestyle and getting preventive care are important in promoting health and wellness.  Follow your health care provider's instructions about healthy  diet, exercising, and getting tested or screened for diseases.  Follow your health care provider's instructions on monitoring your cholesterol and blood pressure. This information is not intended to replace advice given to you by your health care provider. Make sure you discuss any questions you have with your health care provider. Document Revised: 03/13/2018 Document Reviewed: 03/13/2018 Elsevier Patient Education  2020 [employer]  

## 2019-07-28 NOTE — [facility] Notes (Signed)
This visit occurred during the SARS-CoV-2 public health emergency.  Safety protocols were in place, including screening questions prior to the visit, additional usage of staff PPE, and extensive cleaning of exam room while observing appropriate contact time as indicated for disinfecting solutions.  Subjective:     Patient ID: Victoria Strickland , female    DOB: 02/10/1961 , 59 y.o.   MRN: [id number]   Chief Complaint  Patient presents with  . Annual Exam  . Hypertension    HPI  She is here today for a full physical exam. She is followed by GYN for her pelvic exams. She has no specific concerns or complaints at this time.   Hypertension This is a chronic problem. The current episode started more than 1 year ago. The problem has been gradually improving since onset. The problem is controlled. Pertinent negatives include no blurred vision, chest pain, palpitations or shortness of breath. Risk factors for coronary artery disease include post-menopausal state. There is no history of kidney disease or CAD/MI.     Past Medical History:  Diagnosis Date  . Essential hypertension, benign   . Hyperlipidemia      Family History  Problem Relation Age of Onset  . Fibroids Mother   . Cancer Mother   . Aneurysm Father   . Cancer Other      Current Outpatient Medications:  .  Ascorbic Acid (VITAMIN C) 100 MG tablet, Take 100 mg by mouth daily., Disp: , Rfl:  .  Barberry-Oreg Grape-Goldenseal (BERBERINE COMPLEX PO), Take 1,000 mg by mouth. 1 per day, Disp: , Rfl:  .  Black Elderberry 50 MG/5ML SYRP, Take by mouth., Disp: , Rfl:  .  Cholecalciferol (VITAMIN D) 50 MCG (2000 UT) CAPS, Take by mouth., Disp: , Rfl:  .  Coenzyme Q10 (CVS COQ-10 PO), Take 100 mg by mouth daily. Liquid form, Disp: , Rfl:  .  lisinopril (ZESTRIL) 5 MG tablet, TK 1 T PO QD, Disp: 90 tablet, Rfl: 2 .  Multiple Vitamins-Minerals (WOMENS MULTIVITAMIN PO), Take by mouth., Disp: , Rfl:  .  zinc sulfate 220 (50 Zn) MG  capsule, Take 220 mg by mouth daily., Disp: , Rfl:  .  albuterol (PROAIR HFA) 108 (90 Base) MCG/ACT inhaler, ProAir HFA 90 mcg/actuation aerosol inhaler  inhale 2 puffs by mouth every 4 to 6 hours if needed, Disp: , Rfl:    No Known Allergies    The patient states she uses post menopausal status for birth control. Last LMP was No LMP recorded. Patient is postmenopausal.. Negative for Dysmenorrhea Negative for: breast discharge, breast lump(s), breast pain and breast self exam. Associated symptoms include abnormal vaginal bleeding. Pertinent negatives include abnormal bleeding (hematology), anxiety, decreased libido, depression, difficulty falling sleep, dyspareunia, history of infertility, nocturia, sexual dysfunction, sleep disturbances, urinary incontinence, urinary urgency, vaginal discharge and vaginal itching. Diet regular.The patient states her exercise level is  moderate.   . The patient's tobacco use is:  Social History   Tobacco Use  Smoking Status Never Smoker  Smokeless Tobacco Never Used  . She has been exposed to passive smoke. The patient's alcohol use is:  Social History   Substance and Sexual Activity  Alcohol Use Never    Review of Systems  Constitutional: Negative.   HENT: Negative.   Eyes: Negative.  Negative for blurred vision.  Respiratory: Negative.  Negative for shortness of breath.   Cardiovascular: Negative.  Negative for chest pain and palpitations.  Endocrine: Negative.   Genitourinary: Negative.  Musculoskeletal: Positive for arthralgias.       She c/o b/l heel pain. 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 She reports that she drives a lot of hours per day. She may drive as far as [place]. She has no pain when getting out of bed in am. Sx usually occur at end of work day. Denies calf pain.   Skin: Negative.   Allergic/Immunologic: Negative.   Neurological: Negative.   Hematological: Negative.   Psychiatric/Behavioral: Negative.       Today's Vitals   07/28/19 0932  BP: 110/68  Pulse: 64  Temp: 98.2 F (36.8 C)  TempSrc: Oral  Weight: 157 lb 6.4 oz (71.4 kg)  Height: '5\' 3"'  (1.6 m)   Body mass index is 27.88 kg/m.   Objective:  Physical Exam Vitals and nursing note reviewed.  Constitutional:      Appearance: Normal appearance.  HENT:     Head: Normocephalic and atraumatic.     Right Ear: Tympanic membrane, ear canal and external ear normal.     Left Ear: Tympanic membrane, ear canal and external ear normal.     Nose:     Comments: Deferred, masked    Mouth/Throat:     Comments: Deferred, masked Eyes:     Extraocular Movements: Extraocular movements intact.     Conjunctiva/sclera: Conjunctivae normal.     Pupils: Pupils are equal, round, and reactive to light.  Cardiovascular:     Rate and Rhythm: Normal rate and regular rhythm.     Pulses: Normal pulses.          Dorsalis pedis pulses are 2+ on the right side and 2+ on the left side.     Heart sounds: Normal heart sounds.  Pulmonary:     Effort: Pulmonary effort is normal.     Breath sounds: Normal breath sounds.  Chest:     Breasts: Tanner Score is 5.        Right: Normal.        Left: Normal.  Abdominal:     General: Abdomen is flat. Bowel sounds are normal.     Palpations: Abdomen is soft.  Genitourinary:    Comments: deferred Musculoskeletal:        General: Normal range of motion.     Cervical back: Normal range of motion and neck supple.  Feet:     Right foot:     Skin integrity: Skin integrity normal.     Left foot:     Skin integrity: Skin integrity normal.     Comments: No heel tenderness to palpation Skin:    General: Skin is warm and dry.  Neurological:     General: No focal deficit present.     Mental Status: She is alert and oriented to person, place, and time.  Psychiatric:        Mood and Affect: Mood normal.        Behavior: Behavior normal.         Assessment And Plan:     1. Routine general medical  examination at health care facility  A full exam was performed. Importance of monthly self breast exams was discussed with the patient. PATIENT IS ADVISED TO GET 30-45 MINUTES REGULAR EXERCISE NO LESS THAN FOUR TO FIVE DAYS PER WEEK - BOTH WEIGHTBEARING EXERCISES AND AEROBIC ARE RECOMMENDED.  SHE IS ADVISED TO FOLLOW A HEALTHY DIET WITH AT LEAST SIX FRUITS/VEGGIES PER DAY, DECREASE INTAKE OF RED MEAT, AND TO INCREASE  FISH INTAKE TO TWO DAYS PER WEEK.  MEATS/FISH SHOULD NOT BE FRIED, BAKED OR BROILED IS PREFERABLE.  I SUGGEST WEARING SPF 50 SUNSCREEN ON EXPOSED PARTS AND ESPECIALLY WHEN IN THE DIRECT SUNLIGHT FOR AN EXTENDED PERIOD OF TIME.  PLEASE AVOID FAST FOOD RESTAURANTS AND INCREASE YOUR WATER INTAKE.  - CMP14+EGFR - CBC - Lipid panel  2. Essential hypertension, benign  Chronic, well controlled. She will continue with current meds. She is encouraged to avoid adding salt to her foods. EKG performed, SB w/o acute changes. She will rto in six months for re-evaluation.   - POCT Urinalysis Dipstick (81002) - POCT UA - Microalbumin - EKG 12-Lead  3. Pain of both heels  Pt advised she could be developing a heel spur vs. Plantar fasciitis. She was given stretching exercises to perform prior to work, and after her workday as well. She will let me know if her sx persist. If so, I will refer her to podiatry for further evaluation.   Maximino Greenland, MD    THE PATIENT IS ENCOURAGED TO PRACTICE SOCIAL DISTANCING DUE TO THE COVID-19 PANDEMIC.

## 2019-07-29 ENCOUNTER — Encounter: Payer: Self-pay | Admitting: Internal Medicine

## 2019-07-29 LAB — LIPID PANEL
Chol/HDL Ratio: 4.7 ratio — ABNORMAL HIGH (ref 0.0–4.4)
Cholesterol, Total: 255 mg/dL — ABNORMAL HIGH (ref 100–199)
HDL: 54 mg/dL (ref >39)
LDL Chol Calc (NIH): 178 mg/dL — ABNORMAL HIGH (ref 0–99)
Triglycerides: 128 mg/dL (ref 0–149)
VLDL Cholesterol Cal: 23 mg/dL (ref 5–40)

## 2019-07-29 LAB — CMP14+EGFR
ALT: 24 IU/L (ref 0–32)
AST: 35 IU/L (ref 0–40)
Albumin/Globulin Ratio: 1.5 (ref 1.2–2.2)
Albumin: 4.6 g/dL (ref 3.8–4.9)
Alkaline Phosphatase: 79 IU/L (ref 39–117)
BUN/Creatinine Ratio: 11 — ABNORMAL LOW (ref 12–28)
BUN: 10 mg/dL (ref 8–27)
Bilirubin Total: 0.2 mg/dL (ref 0.0–1.2)
CO2: 24 mmol/L (ref 20–29)
Calcium: 10.1 mg/dL (ref 8.7–10.3)
Chloride: 103 mmol/L (ref 96–106)
Creatinine, Ser: 0.9 mg/dL (ref 0.57–1.00)
GFR calc Af Amer: 80 mL/min/{1.73_m2} (ref >59)
GFR calc non Af Amer: 70 mL/min/{1.73_m2} (ref >59)
Globulin, Total: 3 g/dL (ref 1.5–4.5)
Glucose: 84 mg/dL (ref 65–99)
Potassium: 4.5 mmol/L (ref 3.5–5.2)
Sodium: 142 mmol/L (ref 134–144)
Total Protein: 7.6 g/dL (ref 6.0–8.5)

## 2019-07-29 LAB — CBC
Hematocrit: 39 % (ref 34.0–46.6)
Hemoglobin: 12.9 g/dL (ref 11.1–15.9)
MCH: 26.3 pg — ABNORMAL LOW (ref 26.6–33.0)
MCHC: 33.1 g/dL (ref 31.5–35.7)
MCV: 79 fL (ref 79–97)
Platelets: 210 10*3/uL (ref 150–450)
RBC: 4.91 x10E6/uL (ref 3.77–5.28)
RDW: 13.2 % (ref 11.7–15.4)
WBC: 4 10*3/uL (ref 3.4–10.8)

## 2019-07-31 ENCOUNTER — Other Ambulatory Visit: Payer: Self-pay | Admitting: Internal Medicine

## 2019-07-31 MED ORDER — ATORVASTATIN CALCIUM 80 MG PO TABS
80.0000 mg | ORAL_TABLET | Freq: Every day | ORAL | 11 refills | Status: DC
Start: 2019-07-31 — End: 2020-03-22

## 2019-07-31 NOTE — [facility] Notes (Signed)
torva

## 2019-08-01 DIAGNOSIS — Z6826 Body mass index (BMI) 26.0-26.9, adult: Secondary | ICD-10-CM | POA: Diagnosis not present

## 2019-08-01 DIAGNOSIS — Z01419 Encounter for gynecological examination (general) (routine) without abnormal findings: Secondary | ICD-10-CM | POA: Diagnosis not present

## 2019-08-25 DIAGNOSIS — M79672 Pain in left foot: Secondary | ICD-10-CM | POA: Diagnosis not present

## 2019-08-25 DIAGNOSIS — M79671 Pain in right foot: Secondary | ICD-10-CM | POA: Diagnosis not present

## 2019-09-02 ENCOUNTER — Other Ambulatory Visit: Payer: Self-pay | Admitting: Internal Medicine

## 2019-09-18 DIAGNOSIS — Z1231 Encounter for screening mammogram for malignant neoplasm of breast: Secondary | ICD-10-CM | POA: Diagnosis not present

## 2019-09-18 LAB — HM MAMMOGRAPHY: HM Mammogram: ABNORMAL — AB (ref 0–4)

## 2019-09-30 ENCOUNTER — Other Ambulatory Visit: Payer: Self-pay | Admitting: Obstetrics and Gynecology

## 2019-09-30 DIAGNOSIS — R928 Other abnormal and inconclusive findings on diagnostic imaging of breast: Secondary | ICD-10-CM

## 2019-10-01 ENCOUNTER — Other Ambulatory Visit: Payer: Self-pay

## 2019-10-01 ENCOUNTER — Ambulatory Visit (INDEPENDENT_AMBULATORY_CARE_PROVIDER_SITE_OTHER): Payer: BC Managed Care – PPO | Admitting: Internal Medicine

## 2019-10-01 ENCOUNTER — Encounter: Payer: Self-pay | Admitting: Internal Medicine

## 2019-10-01 VITALS — BP 122/76 | HR 58 | Temp 98.1°F | Ht 63.8 in | Wt 157.8 lb

## 2019-10-01 DIAGNOSIS — X32XXXA Exposure to sunlight, initial encounter: Secondary | ICD-10-CM

## 2019-10-01 DIAGNOSIS — Z79899 Other long term (current) drug therapy: Secondary | ICD-10-CM | POA: Diagnosis not present

## 2019-10-01 DIAGNOSIS — E7800 Pure hypercholesterolemia, unspecified: Secondary | ICD-10-CM

## 2019-10-02 LAB — LIPID PANEL
Chol/HDL Ratio: 3.1 ratio (ref 0.0–4.4)
Cholesterol, Total: 147 mg/dL (ref 100–199)
HDL: 48 mg/dL (ref >39)
LDL Chol Calc (NIH): 82 mg/dL (ref 0–99)
Triglycerides: 87 mg/dL (ref 0–149)
VLDL Cholesterol Cal: 17 mg/dL (ref 5–40)

## 2019-10-02 LAB — HEPATIC FUNCTION PANEL
ALT: 19 IU/L (ref 0–32)
AST: 26 IU/L (ref 0–40)
Albumin: 4.5 g/dL (ref 3.8–4.9)
Alkaline Phosphatase: 83 IU/L (ref 48–121)
Bilirubin Total: 0.6 mg/dL (ref 0.0–1.2)
Bilirubin, Direct: 0.17 mg/dL (ref 0.00–0.40)
Total Protein: 7.3 g/dL (ref 6.0–8.5)

## 2019-10-05 NOTE — [facility] Notes (Signed)
This visit occurred during the SARS-CoV-2 public health emergency.  Safety protocols were in place, including screening questions prior to the visit, additional usage of staff PPE, and extensive cleaning of exam room while observing appropriate contact time as indicated for disinfecting solutions.  Subjective:     Patient ID: Victoria Strickland , female    DOB: 03-17-1961 , 59 y.o.   MRN: [id number]   Chief Complaint  Patient presents with  . Hyperlipidemia    HPI  She presents today for cholesterol check. At her last visit, she was prescribed atorvastatin 80mg  M-F only. She has not had any issues with the medication.     Past Medical History:  Diagnosis Date  . Essential hypertension, benign   . Hyperlipidemia      Family History  Problem Relation Age of Onset  . Fibroids Mother   . Cancer Mother   . Aneurysm Father   . Cancer Other      Current Outpatient Medications:  .  Ascorbic Acid (VITAMIN C) POWD, Take 1,000 mg by mouth daily. , Disp: , Rfl:  .  atorvastatin (LIPITOR) 80 MG tablet, Take 1 tablet (80 mg total) by mouth daily. (Patient taking differently: Take 80 mg by mouth daily. mon - fri), Disp: 30 tablet, Rfl: 11 .  Black Cohosh (REMIFEMIN MENOPAUSE PO), Take by mouth., Disp: , Rfl:  .  Black Elderberry 50 MG/5ML SYRP, Take by mouth., Disp: , Rfl:  .  cetirizine (ZYRTEC) 10 MG chewable tablet, Chew 10 mg by mouth daily., Disp: , Rfl:  .  Cholecalciferol (VITAMIN D) 50 MCG (2000 UT) CAPS, Take by mouth., Disp: , Rfl:  .  lisinopril (ZESTRIL) 5 MG tablet, TAKE 1 TABLET BY MOUTH EVERY DAY, Disp: 90 tablet, Rfl: 2 .  Multiple Vitamins-Minerals (WOMENS MULTIVITAMIN PO), Take by mouth., Disp: , Rfl:  .  vitamin E 180 MG (400 UNITS) capsule, Take 400 Units by mouth daily., Disp: , Rfl:  .  zinc sulfate 220 (50 Zn) MG capsule, Take 30 mg by mouth daily. , Disp: , Rfl:  .  Coenzyme Q10 (CVS COQ-10 PO), Take 100 mg by mouth daily. Liquid form (Patient not taking: Reported  on 09/30/2019), Disp: , Rfl:    No Known Allergies   Review of Systems  Constitutional: Negative.   Respiratory: Negative.   Cardiovascular: Negative.        She also reports a "weird sensation" on her chest. She works for [employer] and does a lot of driving. She reports the hot sun coming in from the windshield gives her this weird feeling. This most recently occurred yesterday.  There are no associated palpitations, SOB, or DOE.  Upon further questioning, she admits that she does not always use the a/c.   Gastrointestinal: Negative.   Neurological: Negative.   Psychiatric/Behavioral: Negative.      Today's Vitals   10/01/19 1056  BP: 122/76  Pulse: (!) 58  Temp: 98.1 F (36.7 C)  TempSrc: Oral  Weight: 157 lb 12.8 oz (71.6 kg)  Height: 5' 3.8" (1.621 m)  PainSc: 0-No pain   Body mass index is 27.26 kg/m.   Objective:  Physical Exam Vitals and nursing note reviewed.  Constitutional:      Appearance: Normal appearance.  HENT:     Head: Normocephalic and atraumatic.  Cardiovascular:     Rate and Rhythm: Normal rate and regular rhythm.     Heart sounds: Normal heart sounds.  Pulmonary:     Effort: Pulmonary  effort is normal.     Breath sounds: Normal breath sounds.  Skin:    General: Skin is warm.  Neurological:     General: No focal deficit present.     Mental Status: She is alert.  Psychiatric:        Mood and Affect: Mood normal.        Behavior: Behavior normal.         Assessment And Plan:     1. Pure hypercholesterolemia  Chronic, she reports compliance with statin therapy. I will check lipid panel and adjust meds as needed. She will rto in 4-6 months for re-evaluation.   - Lipid panel  2. Mild sun exposure, initial encounter  She reports repeat occurrences, most recently yesterday 09/30/19.  She is advised to use a/c, on re-circulate setting,  since we are in the hotter, more humid summer months. She will rto should her sx persist.    3. Drug  therapy  - Liver Profile   Maximino Greenland, MD    THE PATIENT IS ENCOURAGED TO PRACTICE SOCIAL DISTANCING DUE TO THE COVID-19 PANDEMIC.

## 2019-10-09 ENCOUNTER — Encounter: Payer: Self-pay | Admitting: Internal Medicine

## 2019-10-14 ENCOUNTER — Other Ambulatory Visit: Payer: Self-pay

## 2019-10-14 ENCOUNTER — Ambulatory Visit: Payer: BLUE CROSS/BLUE SHIELD

## 2019-10-14 ENCOUNTER — Ambulatory Visit
Admission: RE | Admit: 2019-10-14 | Discharge: 2019-10-14 | Disposition: A | Discharge status: Home / Self Care | Payer: BC Managed Care – PPO | Source: Ambulatory Visit | Attending: Obstetrics and Gynecology | Admitting: Obstetrics and Gynecology

## 2019-10-14 DIAGNOSIS — R928 Other abnormal and inconclusive findings on diagnostic imaging of breast: Secondary | ICD-10-CM

## 2019-10-14 DIAGNOSIS — R922 Inconclusive mammogram: Secondary | ICD-10-CM | POA: Diagnosis not present

## 2019-10-21 DIAGNOSIS — M79671 Pain in right foot: Secondary | ICD-10-CM | POA: Diagnosis not present

## 2019-10-27 DIAGNOSIS — M79671 Pain in right foot: Secondary | ICD-10-CM | POA: Diagnosis not present

## 2019-11-12 ENCOUNTER — Encounter: Payer: Self-pay | Admitting: Internal Medicine

## 2019-11-18 ENCOUNTER — Other Ambulatory Visit: Payer: Self-pay

## 2019-11-18 ENCOUNTER — Encounter: Payer: Self-pay | Admitting: Internal Medicine

## 2019-11-18 ENCOUNTER — Ambulatory Visit (INDEPENDENT_AMBULATORY_CARE_PROVIDER_SITE_OTHER): Payer: BC Managed Care – PPO | Admitting: Internal Medicine

## 2019-11-18 VITALS — BP 118/70 | HR 58 | Temp 98.2°F | Ht 63.8 in | Wt 153.2 lb

## 2019-11-18 DIAGNOSIS — Z79899 Other long term (current) drug therapy: Secondary | ICD-10-CM | POA: Diagnosis not present

## 2019-11-18 DIAGNOSIS — I10 Essential (primary) hypertension: Secondary | ICD-10-CM

## 2019-11-18 DIAGNOSIS — E7800 Pure hypercholesterolemia, unspecified: Secondary | ICD-10-CM

## 2019-11-18 NOTE — Patient Instructions (Signed)
Preventing High Cholesterol Cholesterol is a white, waxy substance similar to fat that the human body needs to help build cells. The liver makes all the cholesterol that a person's body needs. Having high cholesterol (hypercholesterolemia) increases a person's risk for heart disease and stroke. Extra (excess) cholesterol comes from the food the person eats. High cholesterol can often be prevented with diet and lifestyle changes. If you already have high cholesterol, you can control it with diet and lifestyle changes and with medicine. How can high cholesterol affect me? If you have high cholesterol, deposits (plaques) may build up on the walls of your arteries. The arteries are the blood vessels that carry blood away from your heart. Plaques make the arteries narrower and stiffer. This can limit or block blood flow and cause blood clots to form. Blood clots:  Are tiny balls of cells that form in your blood.  Can move to the heart or brain, causing a heart attack or stroke. Plaques in arteries greatly increase your risk for heart attack and stroke.Making diet and lifestyle changes can reduce your risk for these conditions that may threaten your life. What can increase my risk? This condition is more likely to develop in people who:  Eat foods that are high in saturated fat or cholesterol. Saturated fat is mostly found in: ? Foods that contain animal fat, such as red meat and some dairy products. ? Certain fatty foods made from plants, such as tropical oils.  Are overweight.  Are not getting enough exercise.  Have a family history of high cholesterol. What actions can I take to prevent this? Nutrition   Eat less saturated fat.  Avoid trans fats (partially hydrogenated oils). These are often found in margarine and in some baked goods, fried foods, and snacks bought in packages.  Avoid precooked or cured meat, such as sausages or meat loaves.  Avoid foods and drinks that have added  sugars.  Eat more fruits, vegetables, and whole grains.  Choose healthy sources of protein, such as fish, poultry, lean cuts of red meat, beans, peas, lentils, and nuts.  Choose healthy sources of fat, such as: ? Nuts. ? Vegetable oils, especially olive oil. ? Fish that have healthy fats (omega-3 fatty acids), such as mackerel or salmon. The items listed above may not be a complete list of recommended foods and beverages. Contact a dietitian for more information. Lifestyle  Lose weight if you are overweight. Losing 5-10 lb (2.3-4.5 kg) can help prevent or control high cholesterol. It can also lower your risk for diabetes and high blood pressure. Ask your health care provider to help you with a diet and exercise plan to lose weight safely.  Do not use any products that contain nicotine or tobacco, such as cigarettes, e-cigarettes, and chewing tobacco. If you need help quitting, ask your health care provider.  Limit your alcohol intake. ? Do not drink alcohol if:  Your health care provider tells you not to drink.  You are pregnant, may be pregnant, or are planning to become pregnant. ? If you drink alcohol:  Limit how much you use to:  0-1 drink a day for women.  0-2 drinks a day for men.  Be aware of how much alcohol is in your drink. In the [place] one drink equals one 12 oz bottle of beer (355 mL), one 5 oz glass of wine (148 mL), or one 1 oz glass of hard liquor (44 mL). Activity   Get enough exercise. Each week, do at   least 150 minutes of exercise that takes a medium level of effort (moderate-intensity exercise). ? This is exercise that:  Makes your heart beat faster and makes you breathe harder than usual.  Allows you to still be able to talk. ? You could exercise in short sessions several times a day or longer sessions a few times a week. For example, on 5 days each week, you could walk fast or ride your bike 3 times a day for 10 minutes each time.  Do exercises as told  by your health care provider. Medicines  In addition to diet and lifestyle changes, your health care provider may recommend medicines to help lower cholesterol. This may be a medicine to lower the amount of cholesterol your liver makes. You may need medicine if: ? Diet and lifestyle changes do not lower your cholesterol enough. ? You have high cholesterol and other risk factors for heart disease or stroke.  Take over-the-counter and prescription medicines only as told by your health care provider. General information  Manage your risk factors for high cholesterol. Talk with your health care provider about all your risk factors and how to lower your risk.  Manage other conditions that you have, such as diabetes or high blood pressure (hypertension).  Have blood tests to check your cholesterol levels at regular points in time as told by your health care provider.  Keep all follow-up visits as told by your health care provider. This is important. Where to find more information  American Heart Association: www.heart.org  National Heart, Lung, and Blood Institute: www.nhlbi.nih.gov Summary  High cholesterol increases your risk for heart disease and stroke. By keeping your cholesterol level low, you can reduce your risk for these conditions.  High cholesterol can often be prevented with diet and lifestyle changes.  Work with your health care provider to manage your risk factors, and have your blood tested regularly. This information is not intended to replace advice given to you by your health care provider. Make sure you discuss any questions you have with your health care provider. Document Revised: 07/12/2018 Document Reviewed: 11/27/2015 Elsevier Patient Education  2020 Elsevier Inc.  

## 2019-11-18 NOTE — [facility] Notes (Signed)
I,Katawbba Wiggins,acting as a Education administrator for Maximino Greenland, MD.,have documented all relevant documentation on the behalf of Maximino Greenland, MD,as directed by  Maximino Greenland, MD while in the presence of Maximino Greenland, MD.  This visit occurred during the SARS-CoV-2 public health emergency.  Safety protocols were in place, including screening questions prior to the visit, additional usage of staff PPE, and extensive cleaning of exam room while observing appropriate contact time as indicated for disinfecting solutions.  Subjective:     Patient ID: Victoria Strickland , female    DOB: 01/21/1961 , 59 y.o.   MRN: [id number]   Chief Complaint  Patient presents with  . Hyperlipidemia    HPI  She presents today for cholesterol check.  She was started on atorvastatin at her last visit. She is taking M-F. She has not had any issues with the medication.     Past Medical History:  Diagnosis Date  . Essential hypertension, benign   . Hyperlipidemia      Family History  Problem Relation Age of Onset  . Fibroids Mother   . Cancer Mother   . Aneurysm Father   . Cancer Other   . Breast cancer Other   . Breast cancer Cousin      Current Outpatient Medications:  .  Ascorbic Acid (VITAMIN C) POWD, Take 1,000 mg by mouth daily. , Disp: , Rfl:  .  atorvastatin (LIPITOR) 80 MG tablet, Take 1 tablet (80 mg total) by mouth daily. (Patient taking differently: Take 80 mg by mouth daily. mon - fri), Disp: 30 tablet, Rfl: 11 .  Black Cohosh (REMIFEMIN MENOPAUSE PO), Take by mouth., Disp: , Rfl:  .  Black Cohosh (REMIFEMIN PO), Take by mouth. Night time dose, Disp: , Rfl:  .  cetirizine (ZYRTEC) 10 MG chewable tablet, Chew 10 mg by mouth daily., Disp: , Rfl:  .  Cholecalciferol (VITAMIN D) 50 MCG (2000 UT) CAPS, Take by mouth., Disp: , Rfl:  .  Elderberry 575 MG/5ML SYRP, Take by mouth., Disp: , Rfl:  .  lisinopril (ZESTRIL) 5 MG tablet, TAKE 1 TABLET BY MOUTH EVERY DAY, Disp: 90 tablet, Rfl: 2 .   Multiple Vitamins-Minerals (WOMENS MULTIVITAMIN PO), Take by mouth., Disp: , Rfl:  .  zinc sulfate 220 (50 Zn) MG capsule, Take 30 mg by mouth daily. , Disp: , Rfl:    No Known Allergies   Review of Systems  Constitutional: Negative.   Respiratory: Negative.   Cardiovascular: Negative.   Gastrointestinal: Negative.   Psychiatric/Behavioral: Negative.   All other systems reviewed and are negative.    Today's Vitals   11/18/19 1223  BP: 118/70  Pulse: (!) 58  Temp: 98.2 F (36.8 C)  TempSrc: Oral  Weight: 153 lb 3.2 oz (69.5 kg)  Height: 5' 3.8" (1.621 m)   Body mass index is 26.46 kg/m.  Wt Readings from Last 3 Encounters:  11/18/19 153 lb 3.2 oz (69.5 kg)  10/01/19 157 lb 12.8 oz (71.6 kg)  07/28/19 157 lb 6.4 oz (71.4 kg)   Objective:  Physical Exam Vitals and nursing note reviewed.  Constitutional:      Appearance: Normal appearance.  HENT:     Head: Normocephalic and atraumatic.  Cardiovascular:     Rate and Rhythm: Normal rate and regular rhythm.     Heart sounds: Normal heart sounds.  Pulmonary:     Breath sounds: Normal breath sounds.  Skin:    General: Skin is warm.  Neurological:  General: No focal deficit present.     Mental Status: She is alert and oriented to person, place, and time.         Assessment And Plan:     1. Pure hypercholesterolemia Comments: Chronic, recently started on atorvastatin. I will check lipid panel/ALT today. I will make further recommendations once her labs are available for review.  - Lipid panel  2. Essential hypertension, benign Comments: Chronic, well controlled. I will check renal function today. She will rto in 4-5 months for re-evaluation.  - BMP8+EGFR  3. Drug therapy - ALT     Patient was given opportunity to ask questions. Patient verbalized understanding of the plan and was able to repeat key elements of the plan. All questions were answered to their satisfaction.  Maximino Greenland, MD   I, Maximino Greenland, MD, have reviewed all documentation for this visit. The documentation on 11/18/19 for the exam, diagnosis, procedures, and orders are all accurate and complete.  THE PATIENT IS ENCOURAGED TO PRACTICE SOCIAL DISTANCING DUE TO THE COVID-19 PANDEMIC.

## 2019-11-19 LAB — LIPID PANEL
Chol/HDL Ratio: 3 ratio (ref 0.0–4.4)
Cholesterol, Total: 130 mg/dL (ref 100–199)
HDL: 44 mg/dL (ref >39)
LDL Chol Calc (NIH): 70 mg/dL (ref 0–99)
Triglycerides: 84 mg/dL (ref 0–149)
VLDL Cholesterol Cal: 16 mg/dL (ref 5–40)

## 2019-11-19 LAB — BMP8+EGFR
BUN/Creatinine Ratio: 9 — ABNORMAL LOW (ref 12–28)
BUN: 8 mg/dL (ref 8–27)
CO2: 27 mmol/L (ref 20–29)
Calcium: 9.8 mg/dL (ref 8.7–10.3)
Chloride: 102 mmol/L (ref 96–106)
Creatinine, Ser: 0.89 mg/dL (ref 0.57–1.00)
GFR calc Af Amer: 81 mL/min/{1.73_m2} (ref >59)
GFR calc non Af Amer: 71 mL/min/{1.73_m2} (ref >59)
Glucose: 83 mg/dL (ref 65–99)
Potassium: 4.5 mmol/L (ref 3.5–5.2)
Sodium: 140 mmol/L (ref 134–144)

## 2019-11-19 LAB — ALT: ALT: 18 IU/L (ref 0–32)

## 2019-11-24 DIAGNOSIS — M79671 Pain in right foot: Secondary | ICD-10-CM | POA: Diagnosis not present

## 2020-01-27 ENCOUNTER — Ambulatory Visit: Payer: BC Managed Care – PPO | Admitting: Internal Medicine

## 2020-03-22 ENCOUNTER — Other Ambulatory Visit: Payer: Self-pay

## 2020-03-22 ENCOUNTER — Encounter: Payer: Self-pay | Admitting: Internal Medicine

## 2020-03-22 ENCOUNTER — Ambulatory Visit (INDEPENDENT_AMBULATORY_CARE_PROVIDER_SITE_OTHER): Payer: BC Managed Care – PPO | Admitting: Internal Medicine

## 2020-03-22 VITALS — BP 142/88 | HR 62 | Temp 97.9°F | Ht 63.8 in | Wt 153.2 lb

## 2020-03-22 DIAGNOSIS — R0789 Other chest pain: Secondary | ICD-10-CM

## 2020-03-22 DIAGNOSIS — Z23 Encounter for immunization: Secondary | ICD-10-CM

## 2020-03-22 DIAGNOSIS — E7800 Pure hypercholesterolemia, unspecified: Secondary | ICD-10-CM

## 2020-03-22 DIAGNOSIS — I10 Essential (primary) hypertension: Secondary | ICD-10-CM | POA: Diagnosis not present

## 2020-03-22 DIAGNOSIS — Z6826 Body mass index (BMI) 26.0-26.9, adult: Secondary | ICD-10-CM | POA: Diagnosis not present

## 2020-03-22 MED ORDER — ATORVASTATIN CALCIUM 80 MG PO TABS: 80.0000 mg | ORAL_TABLET | Freq: Every day | ORAL | 1 refills | Status: DC

## 2020-03-22 NOTE — Progress Notes (Signed)
I,Katawbba Wiggins,acting as a Education administrator for Maximino Greenland, MD.,have documented all relevant documentation on the behalf of Maximino Greenland, MD,as directed by  Maximino Greenland, MD while in the presence of Maximino Greenland, MD.  This visit occurred during the SARS-CoV-2 public health emergency.  Safety protocols were in place, including screening questions prior to the visit, additional usage of staff PPE, and extensive cleaning of exam room while observing appropriate contact time as indicated for disinfecting solutions.  Subjective:     Patient ID: Victoria Strickland , female    DOB: 09/03/1960 , 59 y.o.   MRN: [id number]   Chief Complaint  Patient presents with  . Hypertension    HPI  She presents today for blood pressure and cholesterol check. She reports compliance with meds.   Hypertension This is a chronic problem. The current episode started more than 1 year ago. The problem has been gradually improving since onset. Pertinent negatives include no blurred vision, chest pain, palpitations or shortness of breath. Risk factors for coronary artery disease include dyslipidemia and post-menopausal state.     Past Medical History:  Diagnosis Date  . Essential hypertension, benign   . Hyperlipidemia      Family History  Problem Relation Age of Onset  . Fibroids Mother   . Cancer Mother   . Aneurysm Father   . Cancer Other   . Breast cancer Other   . Breast cancer Cousin      Current Outpatient Medications:  .  cetirizine (ZYRTEC) 10 MG chewable tablet, Chew 10 mg by mouth daily., Disp: , Rfl:  .  Cholecalciferol (VITAMIN D) 50 MCG (2000 UT) CAPS, Take by mouth., Disp: , Rfl:  .  lisinopril (ZESTRIL) 5 MG tablet, TAKE 1 TABLET BY MOUTH EVERY DAY, Disp: 90 tablet, Rfl: 2 .  Multiple Vitamins-Minerals (WOMENS MULTIVITAMIN PO), Take by mouth., Disp: , Rfl:  .  OVER THE COUNTER MEDICATION, Liquid vitamin c Advance immunity support, Disp: , Rfl:  .  OVER THE COUNTER MEDICATION,  5,000 mcg. Liquid vitamin b12, Disp: , Rfl:  .  OVER THE COUNTER MEDICATION, Liquid elderberry with vitamin c, Disp: , Rfl:  .  OVER THE COUNTER MEDICATION, Liquid zinc Immune support overall wellness, Disp: , Rfl:  .  atorvastatin (LIPITOR) 80 MG tablet, Take 1 tablet (80 mg total) by mouth daily. mon - fri, Disp: 90 tablet, Rfl: 1   No Known Allergies   Review of Systems  Constitutional: Negative.   Eyes: Negative for blurred vision.  Respiratory: Negative.  Negative for shortness of breath.   Cardiovascular: Negative.  Negative for chest pain and palpitations.       At end of visit, she admits to having chest pain. Admits she has been lifting her husband's weights for exercise. No sob with exertion. No associated palpitations. Exacerbated by movement.   Gastrointestinal: Negative.   Psychiatric/Behavioral: Negative.   All other systems reviewed and are negative.    Today's Vitals   03/22/20 1133  BP: (!) 142/88  Pulse: 62  Temp: 97.9 F (36.6 C)  TempSrc: Oral  Weight: 153 lb 3.2 oz (69.5 kg)  Height: 5' 3.8" (1.621 m)   Body mass index is 26.46 kg/m.  Wt Readings from Last 3 Encounters:  03/22/20 153 lb 3.2 oz (69.5 kg)  11/18/19 153 lb 3.2 oz (69.5 kg)  10/01/19 157 lb 12.8 oz (71.6 kg)   Objective:  Physical Exam Vitals and nursing note reviewed.  Constitutional:  Appearance: Normal appearance.  HENT:     Head: Normocephalic and atraumatic.  Cardiovascular:     Rate and Rhythm: Normal rate and regular rhythm.     Pulses: Normal pulses.     Heart sounds: Normal heart sounds.  Pulmonary:     Breath sounds: Normal breath sounds.     Comments: Anterior chest wall tender to palpation Skin:    General: Skin is warm.  Neurological:     General: No focal deficit present.     Mental Status: She is alert and oriented to person, place, and time.         Assessment And Plan:     1. Essential hypertension, benign Comments: Chronic, fair control.  Unfortunately, she is dealing with family stressors. She will continue with current meds for now. Encouraged to avoid adding salt to her foods. I will check renal function today.  - BMP8+EGFR  2. Pure hypercholesterolemia Comments: Chronic. Most recent results reviewed, LDL is at goal. She will c/w current meds.   3. Atypical chest pain Comments: Chest wall tenderness to palpation. Sx likely due to muscle strain. Advised to use lighter weights when exercising. She is encouraged to let me know if her sx persist.   4. Need for vaccination Comments: She was givnen flu vaccine today. Advised to wait two weeks to get her COVID booster . - Flu Vaccine QUAD 6+ mos PF IM (Fluarix Quad PF)  5. BMI 26.0-26.9,adult  She is advised to aim for at least 150 minutes of exercise per week.  Patient was given opportunity to ask questions. Patient verbalized understanding of the plan and was able to repeat key elements of the plan. All questions were answered to their satisfaction.  Maximino Greenland, MD   I, Maximino Greenland, MD, have reviewed all documentation for this visit. The documentation on 03/22/20 for the exam, diagnosis, procedures, and orders are all accurate and complete.  THE PATIENT IS ENCOURAGED TO PRACTICE SOCIAL DISTANCING DUE TO THE COVID-19 PANDEMIC.

## 2020-03-22 NOTE — Patient Instructions (Signed)
Exercising to Stay Healthy To become healthy and stay healthy, it is recommended that you do moderate-intensity and vigorous-intensity exercise. You can tell that you are exercising at a moderate intensity if your heart starts beating faster and you start breathing faster but can still hold a conversation. You can tell that you are exercising at a vigorous intensity if you are breathing much harder and faster and cannot hold a conversation while exercising. Exercising regularly is important. It has many health benefits, such as:  Improving overall fitness, flexibility, and endurance.  Increasing bone density.  Helping with weight control.  Decreasing body fat.  Increasing muscle strength.  Reducing stress and tension.  Improving overall health. How often should I exercise? Choose an activity that you enjoy, and set realistic goals. Your health care provider can help you make an activity plan that works for you. Exercise regularly as told by your health care provider. This may include:  Doing strength training two times a week, such as: ? Lifting weights. ? Using resistance bands. ? Push-ups. ? Sit-ups. ? Yoga.  Doing a certain intensity of exercise for a given amount of time. Choose from these options: ? A total of 150 minutes of moderate-intensity exercise every week. ? A total of 75 minutes of vigorous-intensity exercise every week. ? A mix of moderate-intensity and vigorous-intensity exercise every week. Children, pregnant women, people who have not exercised regularly, people who are overweight, and older adults may need to talk with a health care provider about what activities are safe to do. If you have a medical condition, be sure to talk with your health care provider before you start a new exercise program. What are some exercise ideas? Moderate-intensity exercise ideas include:  Walking 1 mile (1.6 km) in about 15  minutes.  Biking.  Hiking.  Golfing.  Dancing.  Water aerobics. Vigorous-intensity exercise ideas include:  Walking 4.5 miles (7.2 km) or more in about 1 hour.  Jogging or running 5 miles (8 km) in about 1 hour.  Biking 10 miles (16.1 km) or more in about 1 hour.  Lap swimming.  Roller-skating or in-line skating.  Cross-country skiing.  Vigorous competitive sports, such as football, basketball, and soccer.  Jumping rope.  Aerobic dancing. What are some everyday activities that can help me to get exercise?  Yard work, such as: ? Pushing a lawn mower. ? Raking and bagging leaves.  Washing your car.  Pushing a stroller.  Shoveling snow.  Gardening.  Washing windows or floors. How can I be more active in my day-to-day activities?  Use stairs instead of an elevator.  Take a walk during your lunch break.  If you drive, park your car farther away from your work or school.  If you take public transportation, get off one stop early and walk the rest of the way.  Stand up or walk around during all of your indoor phone calls.  Get up, stretch, and walk around every 30 minutes throughout the day.  Enjoy exercise with a friend. Support to continue exercising will help you keep a regular routine of activity. What guidelines can I follow while exercising?  Before you start a new exercise program, talk with your health care provider.  Do not exercise so much that you hurt yourself, feel dizzy, or get very short of breath.  Wear comfortable clothes and wear shoes with good support.  Drink plenty of water while you exercise to prevent dehydration or heat stroke.  Work out until your breathing   and your heartbeat get faster. Where to find more information  [place] Department of Health and Human Services: www.hhs.gov  Centers for Disease Control and Prevention (CDC): www.cdc.gov Summary  Exercising regularly is important. It will improve your overall fitness,  flexibility, and endurance.  Regular exercise also will improve your overall health. It can help you control your weight, reduce stress, and improve your bone density.  Do not exercise so much that you hurt yourself, feel dizzy, or get very short of breath.  Before you start a new exercise program, talk with your health care provider. This information is not intended to replace advice given to you by your health care provider. Make sure you discuss any questions you have with your health care provider. Document Revised: 03/02/2017 Document Reviewed: 02/08/2017 Elsevier Patient Education  2020 [employer] Inc.  

## 2020-03-23 LAB — BMP8+EGFR
BUN/Creatinine Ratio: 13 (ref 12–28)
BUN: 13 mg/dL (ref 8–27)
CO2: 24 mmol/L (ref 20–29)
Calcium: 10.2 mg/dL (ref 8.7–10.3)
Chloride: 102 mmol/L (ref 96–106)
Creatinine, Ser: 1.01 mg/dL — ABNORMAL HIGH (ref 0.57–1.00)
GFR calc Af Amer: 70 mL/min/{1.73_m2} (ref >59)
GFR calc non Af Amer: 61 mL/min/{1.73_m2} (ref >59)
Glucose: 90 mg/dL (ref 65–99)
Potassium: 3.5 mmol/L (ref 3.5–5.2)
Sodium: 143 mmol/L (ref 134–144)

## 2020-04-20 ENCOUNTER — Other Ambulatory Visit: Payer: Self-pay | Admitting: Internal Medicine

## 2020-05-04 DIAGNOSIS — U071 COVID-19: Secondary | ICD-10-CM

## 2020-05-04 HISTORY — DX: COVID-19: U07.1

## 2020-05-09 DIAGNOSIS — Z20822 Contact with and (suspected) exposure to covid-19: Secondary | ICD-10-CM | POA: Diagnosis not present

## 2020-05-09 DIAGNOSIS — U071 COVID-19: Secondary | ICD-10-CM | POA: Diagnosis not present

## 2020-05-11 DIAGNOSIS — Z20822 Contact with and (suspected) exposure to covid-19: Secondary | ICD-10-CM | POA: Diagnosis not present

## 2020-05-23 DIAGNOSIS — Z03818 Encounter for observation for suspected exposure to other biological agents ruled out: Secondary | ICD-10-CM | POA: Diagnosis not present

## 2020-06-28 ENCOUNTER — Other Ambulatory Visit: Payer: Self-pay

## 2020-06-28 ENCOUNTER — Encounter: Payer: Self-pay | Admitting: Nurse Practitioner

## 2020-06-28 ENCOUNTER — Ambulatory Visit (INDEPENDENT_AMBULATORY_CARE_PROVIDER_SITE_OTHER): Payer: BC Managed Care – PPO | Admitting: Nurse Practitioner

## 2020-06-28 VITALS — BP 136/84 | HR 62 | Temp 98.3°F | Ht 63.8 in | Wt 155.0 lb

## 2020-06-28 DIAGNOSIS — R29898 Other symptoms and signs involving the musculoskeletal system: Secondary | ICD-10-CM | POA: Diagnosis not present

## 2020-06-28 NOTE — Progress Notes (Signed)
Rutherford Nail as a scribe for Minette Brine, FNP.,have documented all relevant documentation on the behalf of Minette Brine, FNP,as directed by  Minette Brine, FNP while in the presence of Minette Brine, [place]. This visit occurred during the SARS-CoV-2 public health emergency.  Safety protocols were in place, including screening questions prior to the visit, additional usage of staff PPE, and extensive cleaning of exam room while observing appropriate contact time as indicated for disinfecting solutions.  Subjective:     Patient ID: Victoria Strickland , female    DOB: [birth year] , 60 y.o.   MRN: [id number]   Chief Complaint  Patient presents with  . Leg discomfort    (R) leg heaviness at night time     HPI  Pt here today for leg heaviness she stated her (R) leg feels heavy at night. She describes her leg is heavy with fluid build up such as when she gets up to go to the bathroom. If she is sitting for a period of time same issue happens. Denies any falls or unsteady gait. Denies pain. "Feels like it is swollen".       Past Medical History:  Diagnosis Date  . Essential hypertension, benign   . Hyperlipidemia      Family History  Problem Relation Age of Onset  . Fibroids Mother   . Cancer Mother   . Aneurysm Father   . Cancer Other   . Breast cancer Other   . Breast cancer Cousin      Current Outpatient Medications:  .  atorvastatin (LIPITOR) 80 MG tablet, Take 1 tablet (80 mg total) by mouth daily. mon - fri, Disp: 90 tablet, Rfl: 1 .  cetirizine (ZYRTEC) 10 MG chewable tablet, Chew 10 mg by mouth daily., Disp: , Rfl:  .  Cholecalciferol (VITAMIN D) 50 MCG (2000 UT) CAPS, Take by mouth., Disp: , Rfl:  .  lisinopril (ZESTRIL) 5 MG tablet, TAKE 1 TABLET BY MOUTH EVERY DAY, Disp: 90 tablet, Rfl: 2 .  OVER THE COUNTER MEDICATION, Liquid vitamin c Advance immunity support, Disp: , Rfl:  .  OVER THE COUNTER MEDICATION, 5,000 mcg. Liquid vitamin b12, Disp: , Rfl:  .  OVER THE  COUNTER MEDICATION, Liquid elderberry with vitamin c, Disp: , Rfl:  .  OVER THE COUNTER MEDICATION, Liquid zinc Immune support overall wellness, Disp: , Rfl:  .  Multiple Vitamins-Minerals (WOMENS MULTIVITAMIN PO), Take by mouth. (Patient not taking: Reported on 06/28/2020), Disp: , Rfl:    No Known Allergies   Review of Systems  Constitutional: Negative.  Negative for fatigue.  HENT: Negative.   Respiratory: Negative.  Negative for cough.   Cardiovascular: Negative.  Negative for chest pain and leg swelling.  Endocrine: Negative for polydipsia, polyphagia and polyuria.  Musculoskeletal: Negative.   Skin: Negative.   Neurological: Negative for dizziness and headaches.  Psychiatric/Behavioral: Negative.      Today's Vitals   06/28/20 1550  BP: 136/84  Pulse: 62  Temp: 98.3 F (36.8 C)  TempSrc: Oral  Weight: 155 lb (70.3 kg)  Height: 5' 3.8" (1.621 m)   Body mass index is 26.77 kg/m.  Wt Readings from Last 3 Encounters:  06/28/20 155 lb (70.3 kg)  03/22/20 153 lb 3.2 oz (69.5 kg)  11/18/19 153 lb 3.2 oz (69.5 kg)   Objective:  Physical Exam Vitals reviewed.  Constitutional:      General: She is not in acute distress.    Appearance: Normal appearance. She is obese.  HENT:  Head: Normocephalic.     Left Ear: There is no impacted cerumen.     Nose: Congestion present.  Eyes:     Extraocular Movements: Extraocular movements intact.     Conjunctiva/sclera: Conjunctivae normal.     Pupils: Pupils are equal, round, and reactive to light.  Cardiovascular:     Rate and Rhythm: Normal rate and regular rhythm.     Pulses: Normal pulses.     Heart sounds: Normal heart sounds. No murmur heard.   Pulmonary:     Effort: Pulmonary effort is normal. No respiratory distress.     Breath sounds: Normal breath sounds. No wheezing.  Musculoskeletal:        General: No tenderness.  Skin:    General: Skin is warm and dry.     Capillary Refill: Capillary refill takes less than 2  seconds.  Neurological:     General: No focal deficit present.     Mental Status: She is alert and oriented to person, place, and time.     Cranial Nerves: No cranial nerve deficit.  Psychiatric:        Mood and Affect: Mood normal.        Behavior: Behavior normal.        Thought Content: Thought content normal.        Judgment: Judgment normal.         Assessment And Plan:     1. 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   If negative will consider CT of brain.    She is to take aspirin 81 mg daily.  - VAS [place] LOWER EXTREMITY VENOUS (DVT); Future     Patient was given opportunity to ask questions. Patient verbalized understanding of the plan and was able to repeat key elements of the plan. All questions were answered to their satisfaction.  Minette Brine, FNP   I, Minette Brine, FNP, have reviewed all documentation for this visit. The documentation on 06/28/20 for the exam, diagnosis, procedures, and orders are all accurate and complete.   IF YOU HAVE BEEN REFERRED TO A SPECIALIST, IT MAY TAKE 1-2 WEEKS TO SCHEDULE/PROCESS THE REFERRAL. IF YOU HAVE NOT HEARD FROM US/SPECIALIST IN TWO WEEKS, PLEASE GIVE [place] A CALL AT [phone].   THE PATIENT IS ENCOURAGED TO PRACTICE SOCIAL DISTANCING DUE TO THE COVID-19 PANDEMIC.

## 2020-06-30 ENCOUNTER — Other Ambulatory Visit: Payer: Self-pay

## 2020-06-30 ENCOUNTER — Ambulatory Visit (HOSPITAL_COMMUNITY)
Admission: RE | Admit: 2020-06-30 | Discharge: 2020-06-30 | Disposition: A | Discharge status: Home / Self Care | Payer: BC Managed Care – PPO | Source: Ambulatory Visit | Attending: Nurse Practitioner | Admitting: Nurse Practitioner

## 2020-06-30 DIAGNOSIS — R29898 Other symptoms and signs involving the musculoskeletal system: Secondary | ICD-10-CM | POA: Insufficient documentation

## 2020-08-03 ENCOUNTER — Ambulatory Visit (INDEPENDENT_AMBULATORY_CARE_PROVIDER_SITE_OTHER): Payer: BC Managed Care – PPO | Admitting: Internal Medicine

## 2020-08-03 ENCOUNTER — Other Ambulatory Visit: Payer: Self-pay

## 2020-08-03 ENCOUNTER — Encounter: Payer: Self-pay | Admitting: Internal Medicine

## 2020-08-03 VITALS — BP 134/78 | HR 68 | Temp 98.0°F | Ht 64.6 in | Wt 159.6 lb

## 2020-08-03 DIAGNOSIS — Z0000 Encounter for general adult medical examination without abnormal findings: Secondary | ICD-10-CM

## 2020-08-03 DIAGNOSIS — Z6826 Body mass index (BMI) 26.0-26.9, adult: Secondary | ICD-10-CM

## 2020-08-03 DIAGNOSIS — I10 Essential (primary) hypertension: Secondary | ICD-10-CM | POA: Diagnosis not present

## 2020-08-03 DIAGNOSIS — Z2821 Immunization not carried out because of patient refusal: Secondary | ICD-10-CM

## 2020-08-03 DIAGNOSIS — Z23 Encounter for immunization: Secondary | ICD-10-CM

## 2020-08-03 LAB — POCT UA - MICROALBUMIN
Creatinine, POC: 10 mg/dL
Microalbumin Ur, POC: 10 mg/L

## 2020-08-03 LAB — POCT URINALYSIS DIPSTICK
Bilirubin, UA: NEGATIVE
Glucose, UA: NEGATIVE
Ketones, UA: NEGATIVE
Nitrite, UA: NEGATIVE
Protein, UA: NEGATIVE
Spec Grav, UA: <=1.005 — AB (ref 1.010–1.025)
Urobilinogen, UA: 0.2 E.U./dL
pH, UA: 5.5 (ref 5.0–8.0)

## 2020-08-03 NOTE — Progress Notes (Signed)
I,Katawbba Wiggins,acting as a Education administrator for Maximino Greenland, MD.,have documented all relevant documentation on the behalf of Maximino Greenland, MD,as directed by  Maximino Greenland, MD while in the presence of Maximino Greenland, MD.  This visit occurred during the SARS-CoV-2 public health emergency.  Safety protocols were in place, including screening questions prior to the visit, additional usage of staff PPE, and extensive cleaning of exam room while observing appropriate contact time as indicated for disinfecting solutions.  Subjective:     Patient ID: Victoria Strickland , female    DOB: Jun 14, 1960 , 60 y.o.   MRN: [id number]   Chief Complaint  Patient presents with  . Annual Exam  . Hypertension    HPI  She is here today for a full physical exam. She is followed by Dr. Marvel Plan, GYN for her pelvic exams, next appointment scheduled for October 2022. She has no specific concerns or complaints at this time.   Hypertension This is a chronic problem. The current episode started more than 1 year ago. The problem has been gradually improving since onset. The problem is controlled. Pertinent negatives include no blurred vision, chest pain, palpitations or shortness of breath. Risk factors for coronary artery disease include post-menopausal state. The current treatment provides moderate improvement. There is no history of kidney disease or CAD/MI.     Past Medical History:  Diagnosis Date  . Essential hypertension, benign   . Hyperlipidemia      Family History  Problem Relation Age of Onset  . Fibroids Mother   . Cancer Mother        unknown  . Aneurysm Father   . Cancer Other        maternal  . Breast cancer Other   . Breast cancer Cousin        maternal side     Current Outpatient Medications:  .  atorvastatin (LIPITOR) 80 MG tablet, Take 1 tablet (80 mg total) by mouth daily. mon - fri, Disp: 90 tablet, Rfl: 1 .  lisinopril (ZESTRIL) 5 MG tablet, TAKE 1 TABLET BY MOUTH EVERY DAY,  Disp: 90 tablet, Rfl: 2 .  Multiple Vitamins-Minerals (WOMENS MULTIVITAMIN PO), Take by mouth., Disp: , Rfl:  .  Omega-3 Fatty Acids (OMEGA 3 PO), Take by mouth. 1298m, 2 per day, Disp: , Rfl:  .  OVER THE COUNTER MEDICATION, Liquid vitamin c Advance immunity support, Disp: , Rfl:  .  OVER THE COUNTER MEDICATION, 5,000 mcg. Liquid vitamin b12, Disp: , Rfl:  .  OVER THE COUNTER MEDICATION, Liquid elderberry with vitamin c, Disp: , Rfl:  .  OVER THE COUNTER MEDICATION, Morning leaf powder 1 scoop per day, Disp: , Rfl:  .  OVER THE COUNTER MEDICATION, Liquid zinc Immune support overall wellness (Patient not taking: Reported on 08/03/2020), Disp: , Rfl:    No Known Allergies    The patient states she uses post menopausal status for birth control. Last LMP was No LMP recorded. Patient is postmenopausal.. Negative for Dysmenorrhea. Negative for: breast discharge, breast lump(s), breast pain and breast self exam. Associated symptoms include abnormal vaginal bleeding. Pertinent negatives include abnormal bleeding (hematology), anxiety, decreased libido, depression, difficulty falling sleep, dyspareunia, history of infertility, nocturia, sexual dysfunction, sleep disturbances, urinary incontinence, urinary urgency, vaginal discharge and vaginal itching. Diet regular.The patient states her exercise level is  moderate.   . The patient's tobacco use is:  Social History   Tobacco Use  Smoking Status Never Smoker  Smokeless Tobacco Never Used  .  She has been exposed to passive smoke. The patient's alcohol use is:  Social History   Substance and Sexual Activity  Alcohol Use Never    Review of Systems  Constitutional: Negative.   HENT: Negative.   Eyes: Negative.  Negative for blurred vision.  Respiratory: Negative.  Negative for shortness of breath.   Cardiovascular: Negative.  Negative for chest pain and palpitations.  Gastrointestinal: Negative.   Endocrine: Negative.   Genitourinary: Negative.    Musculoskeletal: Negative.   Skin: Negative.   Allergic/Immunologic: Negative.   Neurological: Negative.   Hematological: Negative.   Psychiatric/Behavioral: Negative.   All other systems reviewed and are negative.    Today's Vitals   08/03/20 0845  BP: 134/78  Pulse: 68  Temp: 98 F (36.7 C)  TempSrc: Oral  Weight: 159 lb 9.6 oz (72.4 kg)  Height: 5' 4.6" (1.641 m)   Body mass index is 26.89 kg/m.  Wt Readings from Last 3 Encounters:  08/03/20 159 lb 9.6 oz (72.4 kg)  06/28/20 155 lb (70.3 kg)  03/22/20 153 lb 3.2 oz (69.5 kg)   Objective:  Physical Exam Vitals and nursing note reviewed.  Constitutional:      Appearance: Normal appearance.  HENT:     Head: Normocephalic and atraumatic.     Right Ear: Tympanic membrane, ear canal and external ear normal.     Left Ear: Tympanic membrane, ear canal and external ear normal.     Nose:     Comments: Masked     Mouth/Throat:     Comments: Masked  Eyes:     Extraocular Movements: Extraocular movements intact.     Conjunctiva/sclera: Conjunctivae normal.     Pupils: Pupils are equal, round, and reactive to light.  Cardiovascular:     Rate and Rhythm: Normal rate and regular rhythm.     Pulses: Normal pulses.     Heart sounds: Normal heart sounds.  Pulmonary:     Effort: Pulmonary effort is normal.     Breath sounds: Normal breath sounds.  Chest:  Breasts:     Tanner Score is 5.     Right: Normal.     Left: Normal.    Abdominal:     General: Abdomen is flat. Bowel sounds are normal.     Palpations: Abdomen is soft.  Genitourinary:    Comments: deferred Musculoskeletal:        General: Normal range of motion.     Cervical back: Normal range of motion and neck supple.  Skin:    General: Skin is warm and dry.  Neurological:     General: No focal deficit present.     Mental Status: She is alert and oriented to person, place, and time.  Psychiatric:        Mood and Affect: Mood normal.        Behavior:  Behavior normal.         Assessment And Plan:     1. Routine general medical examination at health care facility Comments: A full exam was performed.  Importance of monthly self breast exams was discussed with the patient.  PATIENT IS ADVISED TO GET 30-45 MINUTES REGULAR EXERCISE NO LESS THAN FOUR TO FIVE DAYS PER WEEK - BOTH WEIGHTBEARING EXERCISES AND AEROBIC ARE RECOMMENDED.  PATIENT IS ADVISED TO FOLLOW A HEALTHY DIET WITH AT LEAST SIX FRUITS/VEGGIES PER DAY, DECREASE INTAKE OF RED MEAT, AND TO INCREASE FISH INTAKE TO TWO DAYS PER WEEK.  MEATS/FISH SHOULD NOT BE FRIED, BAKED  OR BROILED IS PREFERABLE.  IT IS ALSO IMPORTANT TO CUT BACK ON YOUR SUGAR INTAKE. PLEASE AVOID ANYTHING WITH ADDED SUGAR, CORN SYRUP OR OTHER SWEETENERS. IF YOU MUST USE A SWEETENER, YOU CAN TRY STEVIA. IT IS ALSO IMPORTANT TO AVOID ARTIFICIALLY SWEETENERS AND DIET BEVERAGES. LASTLY, I SUGGEST WEARING SPF 50 SUNSCREEN ON EXPOSED PARTS AND ESPECIALLY WHEN IN THE DIRECT SUNLIGHT FOR AN EXTENDED PERIOD OF TIME.  PLEASE AVOID FAST FOOD RESTAURANTS AND INCREASE YOUR WATER INTAKE.  - CBC - CMP14+EGFR - Lipid panel - Insulin, random(561)  2. Essential hypertension, benign Comments: Chronic, controlled. EKG performed, NSR w/ nonspecific T wave abnormality. She will c/w current meds .She is encouraged to follow low sodium diet. She will f/u in six months for re-evaluation.  - POCT Urinalysis Dipstick (81002) - POCT UA - Microalbumin - EKG 12-Lead  3. BMI 26.0-26.9,adult Comments: She is encouraged to aim for at least 150 minutes of exercise per week.  4. Immunization due She does not wish to get 3rd booster or Shingles vaccine at this time.   Patient was given opportunity to ask questions. Patient verbalized understanding of the plan and was able to repeat key elements of the plan. All questions were answered to their satisfaction.   I, Maximino Greenland, MD, have reviewed all documentation for this visit. The documentation  on 08/03/20 for the exam, diagnosis, procedures, and orders are all accurate and complete.  THE PATIENT IS ENCOURAGED TO PRACTICE SOCIAL DISTANCING DUE TO THE COVID-19 PANDEMIC.

## 2020-08-03 NOTE — Patient Instructions (Signed)
Health Maintenance, Female Adopting a healthy lifestyle and getting preventive care are important in promoting health and wellness. Ask your health care provider about:  The right schedule for you to have regular tests and exams.  Things you can do on your own to prevent diseases and keep yourself healthy. What should I know about diet, weight, and exercise? Eat a healthy diet  Eat a diet that includes plenty of vegetables, fruits, low-fat dairy products, and lean protein.  Do not eat a lot of foods that are high in solid fats, added sugars, or sodium.   Maintain a healthy weight Body mass index (BMI) is used to identify weight problems. It estimates body fat based on height and weight. Your health care provider can help determine your BMI and help you achieve or maintain a healthy weight. Get regular exercise Get regular exercise. This is one of the most important things you can do for your health. Most adults should:  Exercise for at least 150 minutes each week. The exercise should increase your heart rate and make you sweat (moderate-intensity exercise).  Do strengthening exercises at least twice a week. This is in addition to the moderate-intensity exercise.  Spend less time sitting. Even light physical activity can be beneficial. Watch cholesterol and blood lipids Have your blood tested for lipids and cholesterol at 61 years of age, then have this test every 5 years. Have your cholesterol levels checked more often if:  Your lipid or cholesterol levels are high.  You are older than 61 years of age.  You are at high risk for heart disease. What should I know about cancer screening? Depending on your health history and family history, you may need to have cancer screening at various ages. This may include screening for:  Breast cancer.  Cervical cancer.  Colorectal cancer.  Skin cancer.  Lung cancer. What should I know about heart disease, diabetes, and high blood  pressure? Blood pressure and heart disease  High blood pressure causes heart disease and increases the risk of stroke. This is more likely to develop in people who have high blood pressure readings, are of African descent, or are overweight.  Have your blood pressure checked: ? Every 3-5 years if you are 18-39 years of age. ? Every year if you are 40 years old or older. Diabetes Have regular diabetes screenings. This checks your fasting blood sugar level. Have the screening done:  Once every three years after age 40 if you are at a normal weight and have a low risk for diabetes.  More often and at a younger age if you are overweight or have a high risk for diabetes. What should I know about preventing infection? Hepatitis B If you have a higher risk for hepatitis B, you should be screened for this virus. Talk with your health care provider to find out if you are at risk for hepatitis B infection. Hepatitis C Testing is recommended for:  Everyone born from 1945 through 1965.  Anyone with known risk factors for hepatitis C. Sexually transmitted infections (STIs)  Get screened for STIs, including gonorrhea and chlamydia, if: ? You are sexually active and are younger than 61 years of age. ? You are older than 61 years of age and your health care provider tells you that you are at risk for this type of infection. ? Your sexual activity has changed since you were last screened, and you are at increased risk for chlamydia or gonorrhea. Ask your health care provider   if you are at risk.  Ask your health care provider about whether you are at high risk for HIV. Your health care provider may recommend a prescription medicine to help prevent HIV infection. If you choose to take medicine to prevent HIV, you should first get tested for HIV. You should then be tested every 3 months for as long as you are taking the medicine. Pregnancy  If you are about to stop having your period (premenopausal) and  you may become pregnant, seek counseling before you get pregnant.  Take 400 to 800 micrograms (mcg) of folic acid every day if you become pregnant.  Ask for birth control (contraception) if you want to prevent pregnancy. Osteoporosis and menopause Osteoporosis is a disease in which the bones lose minerals and strength with aging. This can result in bone fractures. If you are 65 years old or older, or if you are at risk for osteoporosis and fractures, ask your health care provider if you should:  Be screened for bone loss.  Take a calcium or vitamin D supplement to lower your risk of fractures.  Be given hormone replacement therapy (HRT) to treat symptoms of menopause. Follow these instructions at home: Lifestyle  Do not use any products that contain nicotine or tobacco, such as cigarettes, e-cigarettes, and chewing tobacco. If you need help quitting, ask your health care provider.  Do not use street drugs.  Do not share needles.  Ask your health care provider for help if you need support or information about quitting drugs. Alcohol use  Do not drink alcohol if: ? Your health care provider tells you not to drink. ? You are pregnant, may be pregnant, or are planning to become pregnant.  If you drink alcohol: ? Limit how much you use to 0-1 drink a day. ? Limit intake if you are breastfeeding.  Be aware of how much alcohol is in your drink. In the [place] one drink equals one 12 oz bottle of beer (355 mL), one 5 oz glass of wine (148 mL), or one 1 oz glass of hard liquor (44 mL). General instructions  Schedule regular health, dental, and eye exams.  Stay current with your vaccines.  Tell your health care provider if: ? You often feel depressed. ? You have ever been abused or do not feel safe at home. Summary  Adopting a healthy lifestyle and getting preventive care are important in promoting health and wellness.  Follow your health care provider's instructions about healthy  diet, exercising, and getting tested or screened for diseases.  Follow your health care provider's instructions on monitoring your cholesterol and blood pressure. This information is not intended to replace advice given to you by your health care provider. Make sure you discuss any questions you have with your health care provider. Document Revised: 03/13/2018 Document Reviewed: 03/13/2018 Elsevier Patient Education  2021 [employer] Inc.  

## 2020-08-04 LAB — CBC
Hematocrit: 38.9 % (ref 34.0–46.6)
Hemoglobin: 12.3 g/dL (ref 11.1–15.9)
MCH: 25.7 pg — ABNORMAL LOW (ref 26.6–33.0)
MCHC: 31.6 g/dL (ref 31.5–35.7)
MCV: 81 fL (ref 79–97)
Platelets: 184 10*3/uL (ref 150–450)
RBC: 4.78 x10E6/uL (ref 3.77–5.28)
RDW: 13.1 % (ref 11.7–15.4)
WBC: 3.6 10*3/uL (ref 3.4–10.8)

## 2020-08-04 LAB — CMP14+EGFR
ALT: 43 IU/L — ABNORMAL HIGH (ref 0–32)
AST: 32 IU/L (ref 0–40)
Albumin/Globulin Ratio: 1.6 (ref 1.2–2.2)
Albumin: 4.6 g/dL (ref 3.8–4.8)
Alkaline Phosphatase: 78 IU/L (ref 44–121)
BUN/Creatinine Ratio: 12 (ref 12–28)
BUN: 12 mg/dL (ref 8–27)
Bilirubin Total: 0.6 mg/dL (ref 0.0–1.2)
CO2: 25 mmol/L (ref 20–29)
Calcium: 10.2 mg/dL (ref 8.7–10.3)
Chloride: 105 mmol/L (ref 96–106)
Creatinine, Ser: 0.97 mg/dL (ref 0.57–1.00)
Globulin, Total: 2.9 g/dL (ref 1.5–4.5)
Glucose: 79 mg/dL (ref 65–99)
Potassium: 4.8 mmol/L (ref 3.5–5.2)
Sodium: 144 mmol/L (ref 134–144)
Total Protein: 7.5 g/dL (ref 6.0–8.5)
eGFR: 66 mL/min/{1.73_m2} (ref >59)

## 2020-08-04 LAB — LIPID PANEL
Chol/HDL Ratio: 3.3 ratio (ref 0.0–4.4)
Cholesterol, Total: 182 mg/dL (ref 100–199)
HDL: 56 mg/dL (ref >39)
LDL Chol Calc (NIH): 102 mg/dL — ABNORMAL HIGH (ref 0–99)
Triglycerides: 135 mg/dL (ref 0–149)
VLDL Cholesterol Cal: 24 mg/dL (ref 5–40)

## 2020-08-04 LAB — INSULIN, RANDOM: INSULIN: 19.2 u[IU]/mL (ref 2.6–24.9)

## 2020-08-05 ENCOUNTER — Encounter: Payer: Self-pay | Admitting: Internal Medicine

## 2020-08-21 ENCOUNTER — Other Ambulatory Visit: Payer: Self-pay | Admitting: Internal Medicine

## 2020-08-27 ENCOUNTER — Other Ambulatory Visit: Payer: Self-pay

## 2020-08-27 ENCOUNTER — Emergency Department (HOSPITAL_COMMUNITY): Payer: BC Managed Care – PPO

## 2020-08-27 ENCOUNTER — Emergency Department (HOSPITAL_COMMUNITY)
Admission: EM | Admit: 2020-08-27 | Discharge: 2020-08-27 | Disposition: A | Discharge status: Home / Self Care | Payer: BC Managed Care – PPO | Attending: Emergency Medicine | Admitting: Emergency Medicine

## 2020-08-27 DIAGNOSIS — N200 Calculus of kidney: Secondary | ICD-10-CM | POA: Diagnosis not present

## 2020-08-27 DIAGNOSIS — K769 Liver disease, unspecified: Secondary | ICD-10-CM | POA: Diagnosis not present

## 2020-08-27 DIAGNOSIS — R310 Gross hematuria: Secondary | ICD-10-CM | POA: Insufficient documentation

## 2020-08-27 DIAGNOSIS — N201 Calculus of ureter: Secondary | ICD-10-CM | POA: Diagnosis not present

## 2020-08-27 DIAGNOSIS — I10 Essential (primary) hypertension: Secondary | ICD-10-CM | POA: Insufficient documentation

## 2020-08-27 DIAGNOSIS — Z79899 Other long term (current) drug therapy: Secondary | ICD-10-CM | POA: Insufficient documentation

## 2020-08-27 DIAGNOSIS — N23 Unspecified renal colic: Secondary | ICD-10-CM

## 2020-08-27 DIAGNOSIS — N049 Nephrotic syndrome with unspecified morphologic changes: Secondary | ICD-10-CM | POA: Diagnosis not present

## 2020-08-27 DIAGNOSIS — R319 Hematuria, unspecified: Secondary | ICD-10-CM

## 2020-08-27 DIAGNOSIS — R1030 Lower abdominal pain, unspecified: Secondary | ICD-10-CM | POA: Diagnosis not present

## 2020-08-27 HISTORY — DX: Hematuria, unspecified: R31.9

## 2020-08-27 LAB — CBC WITH DIFFERENTIAL/PLATELET
Abs Immature Granulocytes: 0.01 10*3/uL (ref 0.00–0.07)
Basophils Absolute: 0 10*3/uL (ref 0.0–0.1)
Basophils Relative: 0 %
Eosinophils Absolute: 0.1 10*3/uL (ref 0.0–0.5)
Eosinophils Relative: 2 %
HCT: 37.2 % (ref 36.0–46.0)
Hemoglobin: 11.8 g/dL — ABNORMAL LOW (ref 12.0–15.0)
Immature Granulocytes: 0 %
Lymphocytes Relative: 37 %
Lymphs Abs: 2.2 10*3/uL (ref 0.7–4.0)
MCH: 25.9 pg — ABNORMAL LOW (ref 26.0–34.0)
MCHC: 31.7 g/dL (ref 30.0–36.0)
MCV: 81.8 fL (ref 80.0–100.0)
Monocytes Absolute: 0.3 10*3/uL (ref 0.1–1.0)
Monocytes Relative: 4 %
Neutro Abs: 3.4 10*3/uL (ref 1.7–7.7)
Neutrophils Relative %: 57 %
Platelets: 181 10*3/uL (ref 150–400)
RBC: 4.55 MIL/uL (ref 3.87–5.11)
RDW: 13.7 % (ref 11.5–15.5)
WBC: 5.9 10*3/uL (ref 4.0–10.5)
nRBC: 0 % (ref 0.0–0.2)

## 2020-08-27 LAB — BASIC METABOLIC PANEL
Anion gap: 5 (ref 5–15)
BUN: 16 mg/dL (ref 8–23)
CO2: 28 mmol/L (ref 22–32)
Calcium: 10.2 mg/dL (ref 8.9–10.3)
Chloride: 106 mmol/L (ref 98–111)
Creatinine, Ser: 1.02 mg/dL — ABNORMAL HIGH (ref 0.44–1.00)
GFR, Estimated: >60 mL/min (ref >60)
Glucose, Bld: 132 mg/dL — ABNORMAL HIGH (ref 70–99)
Potassium: 3.4 mmol/L — ABNORMAL LOW (ref 3.5–5.1)
Sodium: 139 mmol/L (ref 135–145)

## 2020-08-27 LAB — URINALYSIS, ROUTINE W REFLEX MICROSCOPIC
Bilirubin Urine: NEGATIVE
Glucose, UA: NEGATIVE mg/dL
Ketones, ur: NEGATIVE mg/dL
Leukocytes,Ua: NEGATIVE
Nitrite: NEGATIVE
Protein, ur: NEGATIVE mg/dL
Specific Gravity, Urine: 1.001 — ABNORMAL LOW (ref 1.005–1.030)
pH: 6 (ref 5.0–8.0)

## 2020-08-27 NOTE — ED Provider Notes (Signed)
Emergency Medicine Provider Triage Evaluation Note  Alycea Segoviano , a 60 y.o. female  was evaluated in triage.  Pt complains of hematuria that started this morning. She denies dysuria. Denies nv or fever.  Review of Systems  Positive: hematuria Negative: Dysuria, nv, fever  Physical Exam  BP (!) 176/86 (BP Location: Right Arm)   Pulse 72   Temp 98.5 F (36.9 C) (Oral)   Resp 18   Ht 5\' 5"  (1.651 m)   Wt 65.8 kg   SpO2 94%   BMI 24.13 kg/m  Gen:   Awake, no distress   Resp:  Normal effort  MSK:   Moves extremities without difficulty  Other:  abd soft and nontender  Medical Decision Making  Medically screening exam initiated at 9:12 PM.  Appropriate orders placed.  Amirah Jailen [facility] was informed that the remainder of the evaluation will be completed by another provider, this initial triage assessment does not replace that evaluation, and the importance of remaining in the ED until their evaluation is complete.     Bishop Dublin 08/27/20 2114    Arnaldo Natal, MD 08/27/20 2115

## 2020-08-27 NOTE — ED Provider Notes (Signed)
[facility] DEPT Provider Note   CSN: [id number] Arrival date & time: 08/27/20  2052     History Chief Complaint  Patient presents with  . Hematuria    Victoria Strickland is a 60 y.o. female.  [place] awoke today with some mild lower abdominal pain.  At one point, in the middle of the day, she had some right flank pain.  This was quickly relieved by applying heat.  She continued to have some very minimal lower abdominal pain, and she has had 2 episodes of bloody urine.  No dysuria.  No history of renal stones.  No fever, nausea, vomiting, diarrhea, or constipation.  The history is provided by the patient.  Hematuria This is a new problem. The current episode started 6 to 12 hours ago. Episode frequency: twice. The problem has not changed since onset.Associated symptoms include abdominal pain (lower abdomen). Pertinent negatives include no chest pain, no headaches and no shortness of breath. Nothing aggravates the symptoms. Nothing relieves the symptoms. She has tried a warm compress for the symptoms. The treatment provided mild relief.       Past Medical History:  Diagnosis Date  . Essential hypertension, benign   . Hyperlipidemia     Patient Active Problem List   Diagnosis Date Noted  . Essential hypertension, benign 07/22/2018    Past Surgical History:  Procedure Laterality Date  . TONSILLECTOMY  1973     OB History   No obstetric history on file.     Family History  Problem Relation Age of Onset  . Fibroids Mother   . Cancer Mother        unknown  . Aneurysm Father   . Cancer Other        maternal  . Breast cancer Other   . Breast cancer Cousin        maternal side    Social History   Tobacco Use  . Smoking status: Never Smoker  . Smokeless tobacco: Never Used  Vaping Use  . Vaping Use: Never used  Substance Use Topics  . Alcohol use: Never  . Drug use: Never    Home Medications Prior to Admission  medications   Medication Sig Start Date End Date Taking? Authorizing Provider  atorvastatin (LIPITOR) 80 MG tablet TAKE 1 TABLET(80 MG) BY MOUTH DAILY 08/23/20   [place]endale Chard, MD  lisinopril (ZESTRIL) 5 MG tablet TAKE 1 TABLET BY MOUTH EVERY DAY 04/20/20   [place]endale Chard, MD  Multiple Vitamins-Minerals (WOMENS MULTIVITAMIN PO) Take by mouth.    [provider]  Omega-3 Fatty Acids (OMEGA 3 PO) Take by mouth. 1280mg , 2 per day    [provider]  OVER THE COUNTER MEDICATION Liquid vitamin c Advance immunity support    [provider]  OVER THE COUNTER MEDICATION 5,000 mcg. Liquid vitamin b12    [provider]  OVER THE COUNTER MEDICATION Liquid elderberry with vitamin c    [provider]  OVER THE COUNTER MEDICATION Liquid zinc Immune support overall wellness Patient not taking: Reported on 08/03/2020    [provider]  OVER THE COUNTER MEDICATION Morning leaf powder 1 scoop per day    [provider]    Allergies    Patient has no known allergies.  Review of Systems   Review of Systems  Constitutional: Negative for chills and fever.  HENT: Negative for ear pain and sore throat.   Eyes: Negative for pain and visual disturbance.  Respiratory: Negative for cough and shortness of breath.   Cardiovascular: Negative for chest pain and palpitations.  Gastrointestinal: Positive for abdominal pain (lower abdomen). Negative for vomiting.  Genitourinary: Positive for hematuria. Negative for dysuria.  Musculoskeletal: Negative for arthralgias and back pain.  Skin: Negative for color change and rash.  Neurological: Negative for seizures, syncope and headaches.  All other systems reviewed and are negative.   Physical Exam Updated Vital Signs BP (!) 176/86 (BP Location: Right Arm)   Pulse 72   Temp 98.5 F (36.9 C) (Oral)   Resp 18   Ht 5\' 5"  (1.651 m)   Wt 65.8 kg   SpO2 94%   BMI 24.13 kg/m   Physical Exam Vitals and  nursing note reviewed.  HENT:     Head: Normocephalic and atraumatic.  Eyes:     General: No scleral icterus. Pulmonary:     Effort: Pulmonary effort is normal. No respiratory distress.  Abdominal:     General: There is no distension.     Palpations: Abdomen is soft.     Tenderness: There is no abdominal tenderness. There is no right CVA tenderness.  Musculoskeletal:     Cervical back: Normal range of motion.  Skin:    General: Skin is warm and dry.  Neurological:     General: No focal deficit present.     Mental Status: She is alert and oriented to person, place, and time.  Psychiatric:        Mood and Affect: Mood normal.     ED Results / Procedures / Treatments   Labs (all labs ordered are listed, but only abnormal results are displayed) Labs Reviewed  URINALYSIS, ROUTINE W REFLEX MICROSCOPIC - Abnormal; Notable for the following components:      Result Value   Color, Urine STRAW (*)    Specific Gravity, Urine 1.001 (*)    Hgb urine dipstick LARGE (*)    Bacteria, UA RARE (*)    All other components within normal limits  BASIC METABOLIC PANEL - Abnormal; Notable for the following components:   Potassium 3.4 (*)    Glucose, Bld 132 (*)    Creatinine, Ser 1.02 (*)    All other components within normal limits  CBC WITH DIFFERENTIAL/PLATELET - Abnormal; Notable for the following components:   Hemoglobin 11.8 (*)    MCH 25.9 (*)    All other components within normal limits    EKG None  Radiology CT Renal Stone Study  Result Date: 08/27/2020 CLINICAL DATA:  Urinary tract stone, symptomatic/complicated Right flank pain. EXAM: CT ABDOMEN AND PELVIS WITHOUT CONTRAST TECHNIQUE: Multidetector CT imaging of the abdomen and pelvis was performed following the standard protocol without IV contrast. COMPARISON:  None. FINDINGS: Lower chest: Linear atelectasis or scarring in the right middle and left lower lobes. No confluent consolidation or pleural effusion. Hepatobiliary:  Multiple small low-density lesions scattered throughout the liver, larger lesions measuring simple fluid density and likely cysts. Many of these are too small to accurately characterize. Gallbladder physiologically distended, no calcified stone. No biliary dilatation. Pancreas: No ductal dilatation or inflammation. Spleen: Normal in size without focal abnormality. Adrenals/Urinary Tract: No adrenal nodule. 7 x 6 mm stone in the right renal pelvis with slight fullness of the pelvis renal calices and trace perinephric edema. No additional right renal calculi. Cortical cyst in the posterior mid right kidney. There is a 2-3 mm stone in the distal right ureter just proximal to the ureterovesicular junction, series 4, image 74.  Clustered calcifications in the posterior mid left kidney spanning 8 mm. No left hydronephrosis. No left ureteral stones. Urinary bladder is near completely empty. No bladder stone or wall thickening Stomach/Bowel: Stomach is within normal limits. Appendix appears normal. No evidence of bowel wall thickening, distention, or inflammatory changes. Vascular/Lymphatic: Mild aortic atherosclerosis. No aneurysm. No bulky enlarged abdominopelvic lymph nodes. Reproductive: Anteverted uterus, normal for noncontrast technique. No adnexal mass. Other: No free air, free fluid, or intra-abdominal fluid collection. Musculoskeletal: Mild degenerative disc disease at L5-S1 with vacuum phenomenon. There are no acute or suspicious osseous abnormalities. IMPRESSION: 1. A 7 x 6 mm stone in the right renal pelvis with slight fullness of the pelvis renal calices and trace perinephric edema. This may be intermittently obstruction. There is a 2-3 mm stone in the distal right ureter just proximal to the ureterovesicular junction, but no significant ureteral dilatation. 2. Clustered calcifications in the posterior mid left kidney spanning 8 mm, nonobstructing stones versus parenchymal calcifications. 3. Multiple low-density  lesions in the liver, larger lesions measuring simple fluid density and likely cysts. Many of these are too small to accurately characterize. Favor cysts or biliary hamartomas. Aortic Atherosclerosis (ICD10-I70.0). Electronically Signed   By: Keith Rake M.D.   On: 08/27/2020 21:56    Procedures Procedures   Medications Ordered in ED Medications - No data to display  ED Course  I have reviewed the triage vital signs and the nursing notes.  Pertinent labs & imaging results that were available during my care of the patient were reviewed by me and considered in my medical decision making (see chart for details).    MDM Rules/Calculators/A&P                          Victoria Strickland is well-appearing.  She has had some minimal right flank pain and some mild lower abdominal pain.  Her main complaint is hematuria.  Vital signs are normal with the exception of some mild hypertension.  Urinalysis was not consistent with obvious infection, and I added a stone study.  This did reveal kidney stones, and this is the likely source of her hematuria.  Fortunately, she is relatively asymptomatic, and I think she is suitable for discharge home.  She has been given urologic follow-up instructions. Final Clinical Impression(s) / ED Diagnoses Final diagnoses:  Renal colic on right side  Gross hematuria    Rx / DC Orders ED Discharge Orders    None       Arnaldo Natal, MD 08/27/20 2234

## 2020-08-27 NOTE — ED Triage Notes (Signed)
Pt came in with c/o dark urine that started today. No frequency or burning noted. Pt states she has intermittent R flank pain

## 2020-08-31 ENCOUNTER — Encounter: Payer: Self-pay | Admitting: Internal Medicine

## 2020-08-31 ENCOUNTER — Telehealth: Payer: Self-pay

## 2020-08-31 DIAGNOSIS — N202 Calculus of kidney with calculus of ureter: Secondary | ICD-10-CM | POA: Diagnosis not present

## 2020-08-31 NOTE — Telephone Encounter (Signed)
Transition Care Management Follow-up Telephone Call  Date of discharge and from where: 08/27/2020 [facility]  How have you been since you were released from the hospital? [place]  Any questions or concerns? Yes its still blood in the pt's urine, she followed up with a urologist today Items Reviewed:  Did the pt receive and understand the discharge instructions provided? Yes   Medications obtained and verified? Yes   Other? No   Any new allergies since your discharge? No   Dietary orders reviewed? None given  Do you have support at home? Yes   Home Care and Equipment/Supplies: Were home health services ordered? no If so, what is the name of the agency?  Has the agency set up a time to come to the patient's home? no Were any new equipment or medical supplies ordered?  No What is the name of the medical supply agency?  Were you able to get the supplies/equipment? no Do you have any questions related to the use of the equipment or supplies? No  Functional Questionnaire: (I = Independent and D = Dependent) ADLs: I  Bathing/Dressing- I  Meal Prep- I  Eating- I  Maintaining continence-  I  Transferring/Ambulation- I  Managing Meds- I  Follow up appointments reviewed:   PCP Hospital f/u appt confirmed? No  Scheduled to see  on  @   [facility] f/u appt confirmed? Yes    Are transportation arrangements needed? No   If their condition worsens, is the pt aware to call PCP or go to the Emergency Dept.? Yes  Was the patient provided with contact information for the PCP's office or ED? Yes  Was to pt encouraged to call back with questions or concerns? yes

## 2020-09-01 DIAGNOSIS — Z03818 Encounter for observation for suspected exposure to other biological agents ruled out: Secondary | ICD-10-CM | POA: Diagnosis not present

## 2020-09-14 DIAGNOSIS — N201 Calculus of ureter: Secondary | ICD-10-CM | POA: Diagnosis not present

## 2020-09-21 ENCOUNTER — Other Ambulatory Visit: Payer: Self-pay | Admitting: Urology

## 2020-09-29 ENCOUNTER — Other Ambulatory Visit: Payer: Self-pay | Admitting: Internal Medicine

## 2020-09-29 DIAGNOSIS — Z1231 Encounter for screening mammogram for malignant neoplasm of breast: Secondary | ICD-10-CM

## 2020-10-07 ENCOUNTER — Encounter (HOSPITAL_BASED_OUTPATIENT_CLINIC_OR_DEPARTMENT_OTHER): Payer: Self-pay | Admitting: Urology

## 2020-10-07 ENCOUNTER — Other Ambulatory Visit: Payer: Self-pay

## 2020-10-07 DIAGNOSIS — R232 Flushing: Secondary | ICD-10-CM

## 2020-10-07 DIAGNOSIS — N200 Calculus of kidney: Secondary | ICD-10-CM

## 2020-10-07 DIAGNOSIS — Z973 Presence of spectacles and contact lenses: Secondary | ICD-10-CM

## 2020-10-07 DIAGNOSIS — Z972 Presence of dental prosthetic device (complete) (partial): Secondary | ICD-10-CM

## 2020-10-07 HISTORY — DX: Presence of spectacles and contact lenses: Z97.3

## 2020-10-07 HISTORY — DX: Presence of dental prosthetic device (complete) (partial): Z97.2

## 2020-10-07 HISTORY — DX: Flushing: R23.2

## 2020-10-07 HISTORY — DX: Calculus of kidney: N20.0

## 2020-10-07 NOTE — Progress Notes (Signed)
Spoke w/ via phone for pre-op interview---pt Lab needs dos----  I stat 8             Lab results------ekg 08-03-2020 epic COVID test -----patient states asymptomatic no test needed Arrive at -------615 am 10-12-2020 NPO after MN NO Solid Food.  Clear liquids from MN until---515 am then npo Med rec completed Medications to take morning of surgery -----atorvastatin Diabetic medication -----n/a Patient instructed no nail polish to be worn day of surgery Patient instructed to bring photo id and insurance card day of surgery Patient aware to have Driver (ride ) / caregiver   spouse harvey cell [phone]  for 24 hours after surgery  Patient Special Instructions -----none Pre-Op special Istructions -----none Patient verbalized understanding of instructions that were given at this phone interview. Patient denies shortness of breath, chest pain, fever, cough at this phone interview.

## 2020-10-11 NOTE — H&P (Signed)
CC/HPI: cc: urolithiaiss   08/31/20: 60 year old woman with right flank pain and gross hematuria found to have 2 mm distal right ureteral calculus and 7 mm right renal pelvis calculus also with multiple nonobstructing left renal calculi. Patient has never passed a stone before. She has no pain at the moment. She did feel like she may be passed something on Saturday because she had sharp pain that then resolved. She is still experiencing some hematuria.   09/14/2020: 60 year old female who presents today for follow-up after diagnosis of a 2 mm right distal ureteral calculi and a 7 mm right renal pelvic calculi. She denies any pain at this time. She denies fevers, chills. She has not seen any more blood in her urine. She has not seen a stone pass.     ALLERGIES: None   MEDICATIONS: Lisinopril 5 mg tablet  Tamsulosin Hcl 0.4 mg capsule 1 capsule PO Q HS  Atorvastatin Calcium 80 mg tablet     GU PSH: None   NON-GU PSH: Tonsillectomy     GU PMH: Renal calculus - 08/31/2020 Ureteral calculus - 08/31/2020    NON-GU PMH: Hypercholesterolemia Hypertension    FAMILY HISTORY: Cancer - Runs in Family Heart Disease - Runs in Family   SOCIAL HISTORY: Marital Status: Married Preferred Language: English; Ethnicity: Not Hispanic Or Latino; Race: White Current Smoking Status: Patient has never smoked.   Tobacco Use Assessment Completed: Used Tobacco in last 30 days? Has never drank.  Does not drink caffeine.    REVIEW OF SYSTEMS:    GU Review Female:   Patient denies frequent urination, hard to postpone urination, burning /pain with urination, get up at night to urinate, leakage of urine, stream starts and stops, trouble starting your stream, have to strain to urinate, and being pregnant.  Gastrointestinal (Upper):   Patient denies nausea, vomiting, and indigestion/ heartburn.  Musculoskeletal:   Patient denies back pain and joint pain.  Neurological:   Patient denies headaches and dizziness.   Psychologic:   Patient denies depression and anxiety.   VITAL SIGNS:      09/14/2020 10:08 AM  BP 158/79 mmHg  Heart Rate 58 /min  Temperature 97.3 F / 36.2 C   GU PHYSICAL EXAMINATION:      Notes: no CVAT   MULTI-SYSTEM PHYSICAL EXAMINATION:    Constitutional: Well-nourished. No physical deformities. Normally developed. Good grooming.  Respiratory: No labored breathing, no use of accessory muscles.   Cardiovascular: Normal temperature, normal extremity pulses, no swelling, no varicosities.  Skin: No paleness, no jaundice, no cyanosis. No lesion, no ulcer, no rash.  Neurologic / Psychiatric: Oriented to time, oriented to place, oriented to person. No depression, no anxiety, no agitation.  Gastrointestinal: No mass, no tenderness, no rigidity, non obese abdomen.     Complexity of Data:  Source Of History:  Patient, Medical Record Summary  Records Review:   Previous Hospital Records, Previous Patient Records  Urine Test Review:   Urinalysis  X-Ray Review: KUB: Reviewed Films. Discussed With Patient.  Renal Ultrasound (Limited): Reviewed Films. Reviewed Report. Discussed With Patient.     09/14/20  Urinalysis  Urine Appearance Cloudy   Urine Color Red   Urine Glucose Neg mg/dL  Urine Bilirubin Neg mg/dL  Urine Ketones Neg mg/dL  Urine Specific Gravity 1.025   Urine Blood 3+ ery/uL  Urine pH <=5.0   Urine Protein 1+ mg/dL  Urine Urobilinogen 0.2 mg/dL  Urine Nitrites Neg   Urine Leukocyte Esterase Neg leu/uL  Urine WBC/hpf 0 -  5/hpf   Urine RBC/hpf 20 - 40/hpf   Urine Epithelial Cells NS (Not Seen)   Urine Bacteria NS (Not Seen)   Urine Mucous Present   Urine Yeast NS (Not Seen)   Urine Trichomonas Not Present   Urine Cystals NS (Not Seen)   Urine Casts NS (Not Seen)   Urine Sperm Not Present    PROCEDURES:         Renal Ultrasound (Limited) - 27517  Kidney: Right Length: 9.5 cm Depth: 4.5 cm Cortical Width: 1.4 cm Width: 5.2 cm    Right Kidney/Ureter:   Calcification within renal pelvis. Mild hydro. Simple cyst LP.  Bladder:  Bladder volume 312.61 ml      Patient confirmed No Neulasta OnPro Device.            KUB - [id number]  A single view of the abdomen is obtained. Bilateral shadows are visualized. Along the expected course of the right renal shadow at the level of L3 there is an opacity measuring approximately 3 mm. It is difficult to discern if this is related to bowel gas or spinous process. I do not appreciate any other opacities along the at their course of the ureters or within either renal shadow. There is a prominent overlying bowel gas pattern.      . Patient confirmed No Neulasta OnPro Device.           Urinalysis w/Scope Dipstick Dipstick Cont'd Micro  Color: Red Bilirubin: Neg mg/dL WBC/hpf: 0 - 5/hpf  Appearance: Cloudy Ketones: Neg mg/dL RBC/hpf: 20 - 40/hpf  Specific Gravity: 1.025 Blood: 3+ ery/uL Bacteria: NS (Not Seen)  pH: <=5.0 Protein: 1+ mg/dL Cystals: NS (Not Seen)  Glucose: Neg mg/dL Urobilinogen: 0.2 mg/dL Casts: NS (Not Seen)    Nitrites: Neg Trichomonas: Not Present    Leukocyte Esterase: Neg leu/uL Mucous: Present      Epithelial Cells: NS (Not Seen)      Yeast: NS (Not Seen)      Sperm: Not Present         Notes:   . Patient confirmed No Neulasta OnPro Device.     ASSESSMENT:      ICD-10 Details  1 GU:   Ureteral calculus - N20.1 Right, Acute, Uncomplicated   PLAN:           Orders Labs CULTURE, URINE  X-Rays: Renal Ultrasound (Limited) - right please          Schedule Return Visit/Planned Activity: Next Available Appointment - Schedule Surgery          Document Letter(s):  Created for Patient: Clinical Summary         Notes:   Urinalysis with continued hematuria, renal ultrasound does show some mild hydronephrosis along the right kidney. Unfortunately, I am unable to appreciate a 7 mm opacity on KUB imaging. There is a small 3-4 mm opacity at the level of L3 however this could be  related to bowel gas. I discussed this with the patient as well as her continued hematuria and advised that most likely ureteroscopy would be advisable. I will further discuss this with her urologist. She was given strict return precautions in the interval for any worsening symptomatology. She verbalized her understanding. Currently, her pain is well controlled. Follow up will be pending.

## 2020-10-11 NOTE — Anesthesia Preprocedure Evaluation (Addendum)
Anesthesia Evaluation  Patient identified by MRN, date of birth, ID band Patient awake    Reviewed: Allergy & Precautions, NPO status , Patient's Chart, lab work & pertinent test results  Airway Mallampati: I  TM Distance: >3 FB Neck ROM: Full    Dental  (+) Upper Dentures   Pulmonary neg pulmonary ROS,    Pulmonary exam normal        Cardiovascular hypertension, Pt. on medications  Rhythm:Regular Rate:Normal     Neuro/Psych negative neurological ROS  negative psych ROS   GI/Hepatic negative GI ROS, Neg liver ROS,   Endo/Other  negative endocrine ROS  Renal/GU Renal diseaseRenal calculus  Bladder dysfunction      Musculoskeletal negative musculoskeletal ROS (+)   Abdominal (+)  Abdomen: soft.    Peds  Hematology negative hematology ROS (+)   Anesthesia Other Findings   Reproductive/Obstetrics                            Anesthesia Physical Anesthesia Plan  ASA: 2  Anesthesia Plan: General   Post-op Pain Management:    Induction: Intravenous  PONV Risk Score and Plan: 2 and Ondansetron, Dexamethasone, Midazolam and Treatment may vary due to age or medical condition  Airway Management Planned: Mask and LMA  Additional Equipment: None  Intra-op Plan:   Post-operative Plan: Extubation in OR  Informed Consent: I have reviewed the patients History and Physical, chart, labs and discussed the procedure including the risks, benefits and alternatives for the proposed anesthesia with the patient or authorized representative who has indicated his/her understanding and acceptance.     Dental advisory given  Plan Discussed with: CRNA  Anesthesia Plan Comments: (Lab Results      Component                Value               Date                      WBC                      5.9                 08/27/2020                HGB                      11.8 (L)            08/27/2020                 HCT                      37.2                08/27/2020                MCV                      81.8                08/27/2020                PLT                      181  08/27/2020           Lab Results      Component                Value               Date                      NA                       139                 08/27/2020                K                        3.4 (L)             08/27/2020                CO2                      28                  08/27/2020                GLUCOSE                  132 (H)             08/27/2020                BUN                      16                  08/27/2020                CREATININE               1.02 (H)            08/27/2020                CALCIUM                  10.2                08/27/2020                GFRNONAA                 >60                 08/27/2020                GFRAA                    70                  03/22/2020          )       Anesthesia Quick Evaluation

## 2020-10-12 ENCOUNTER — Encounter (HOSPITAL_BASED_OUTPATIENT_CLINIC_OR_DEPARTMENT_OTHER)
Admission: RE | Disposition: A | Discharge status: Home / Self Care | Payer: Self-pay | Source: Home / Self Care | Attending: Urology

## 2020-10-12 ENCOUNTER — Encounter (HOSPITAL_BASED_OUTPATIENT_CLINIC_OR_DEPARTMENT_OTHER): Payer: Self-pay | Admitting: Urology

## 2020-10-12 ENCOUNTER — Ambulatory Visit (HOSPITAL_BASED_OUTPATIENT_CLINIC_OR_DEPARTMENT_OTHER): Payer: BC Managed Care – PPO | Admitting: Anesthesiology

## 2020-10-12 ENCOUNTER — Ambulatory Visit (HOSPITAL_BASED_OUTPATIENT_CLINIC_OR_DEPARTMENT_OTHER)
Admission: RE | Admit: 2020-10-12 | Discharge: 2020-10-12 | Disposition: A | Discharge status: Home / Self Care | Payer: BC Managed Care – PPO | Attending: Urology | Admitting: Urology

## 2020-10-12 ENCOUNTER — Other Ambulatory Visit: Payer: Self-pay

## 2020-10-12 DIAGNOSIS — E785 Hyperlipidemia, unspecified: Secondary | ICD-10-CM | POA: Diagnosis not present

## 2020-10-12 DIAGNOSIS — N202 Calculus of kidney with calculus of ureter: Secondary | ICD-10-CM | POA: Diagnosis not present

## 2020-10-12 DIAGNOSIS — Z79899 Other long term (current) drug therapy: Secondary | ICD-10-CM | POA: Insufficient documentation

## 2020-10-12 DIAGNOSIS — Z8701 Personal history of pneumonia (recurrent): Secondary | ICD-10-CM | POA: Diagnosis not present

## 2020-10-12 DIAGNOSIS — N201 Calculus of ureter: Secondary | ICD-10-CM | POA: Diagnosis not present

## 2020-10-12 DIAGNOSIS — N132 Hydronephrosis with renal and ureteral calculous obstruction: Secondary | ICD-10-CM | POA: Insufficient documentation

## 2020-10-12 DIAGNOSIS — Z8249 Family history of ischemic heart disease and other diseases of the circulatory system: Secondary | ICD-10-CM | POA: Diagnosis not present

## 2020-10-12 DIAGNOSIS — I10 Essential (primary) hypertension: Secondary | ICD-10-CM | POA: Diagnosis not present

## 2020-10-12 DIAGNOSIS — N200 Calculus of kidney: Secondary | ICD-10-CM | POA: Diagnosis not present

## 2020-10-12 HISTORY — PX: CYSTOSCOPY WITH RETROGRADE PYELOGRAM, URETEROSCOPY AND STENT PLACEMENT: SHX5789

## 2020-10-12 HISTORY — DX: Pneumonia, unspecified organism: J18.9

## 2020-10-12 LAB — POCT I-STAT, CHEM 8
BUN: 11 mg/dL (ref 8–23)
Calcium, Ion: 1.34 mmol/L (ref 1.15–1.40)
Chloride: 105 mmol/L (ref 98–111)
Creatinine, Ser: 0.9 mg/dL (ref 0.44–1.00)
Glucose, Bld: 102 mg/dL — ABNORMAL HIGH (ref 70–99)
HCT: 41 % (ref 36.0–46.0)
Hemoglobin: 13.9 g/dL (ref 12.0–15.0)
Potassium: 3.2 mmol/L — ABNORMAL LOW (ref 3.5–5.1)
Sodium: 145 mmol/L (ref 135–145)
TCO2: 27 mmol/L (ref 22–32)

## 2020-10-12 SURGERY — CYSTOURETEROSCOPY, WITH RETROGRADE PYELOGRAM AND STENT INSERTION
Anesthesia: General | Site: Ureter | Laterality: Right

## 2020-10-12 MED ORDER — PROPOFOL 10 MG/ML IV BOLUS
INTRAVENOUS | Status: DC | PRN
  Administered 2020-10-12: 150 mg via INTRAVENOUS

## 2020-10-12 MED ORDER — LIDOCAINE HCL (PF) 2 % IJ SOLN
INTRAMUSCULAR | Status: AC
  Filled 2020-10-12: qty 5

## 2020-10-12 MED ORDER — CEFAZOLIN SODIUM-DEXTROSE 2-4 GM/100ML-% IV SOLN
INTRAVENOUS | Status: AC
  Filled 2020-10-12: qty 100

## 2020-10-12 MED ORDER — FENTANYL CITRATE (PF) 100 MCG/2ML IJ SOLN: 25.0000 ug | INTRAMUSCULAR | Status: DC | PRN

## 2020-10-12 MED ORDER — ONDANSETRON HCL 4 MG/2ML IJ SOLN
INTRAMUSCULAR | Status: DC | PRN
  Administered 2020-10-12: 4 mg via INTRAVENOUS

## 2020-10-12 MED ORDER — IOHEXOL 300 MG/ML  SOLN
INTRAMUSCULAR | Status: DC | PRN
  Administered 2020-10-12: 10 mL

## 2020-10-12 MED ORDER — TRAMADOL HCL 50 MG PO TABS: 50.0000 mg | ORAL_TABLET | Freq: Four times a day (QID) | ORAL | 0 refills | Status: DC | PRN

## 2020-10-12 MED ORDER — MIDAZOLAM HCL 5 MG/5ML IJ SOLN
INTRAMUSCULAR | Status: DC | PRN
  Administered 2020-10-12: 2 mg via INTRAVENOUS

## 2020-10-12 MED ORDER — DEXAMETHASONE SODIUM PHOSPHATE 4 MG/ML IJ SOLN
INTRAMUSCULAR | Status: DC | PRN
  Administered 2020-10-12: 10 mg via INTRAVENOUS

## 2020-10-12 MED ORDER — FENTANYL CITRATE (PF) 100 MCG/2ML IJ SOLN
INTRAMUSCULAR | Status: AC
  Filled 2020-10-12: qty 2

## 2020-10-12 MED ORDER — TAMSULOSIN HCL 0.4 MG PO CAPS: 0.4000 mg | ORAL_CAPSULE | Freq: Every day | ORAL | 0 refills | Status: DC

## 2020-10-12 MED ORDER — SODIUM CHLORIDE 0.9 % IR SOLN
Status: DC | PRN
  Administered 2020-10-12: 1500 mL via INTRAVESICAL

## 2020-10-12 MED ORDER — MIDAZOLAM HCL 2 MG/2ML IJ SOLN
INTRAMUSCULAR | Status: AC
  Filled 2020-10-12: qty 2

## 2020-10-12 MED ORDER — KETOROLAC TROMETHAMINE 30 MG/ML IJ SOLN
INTRAMUSCULAR | Status: AC
  Filled 2020-10-12: qty 1

## 2020-10-12 MED ORDER — CEFAZOLIN SODIUM-DEXTROSE 2-4 GM/100ML-% IV SOLN
2.0000 g | INTRAVENOUS | Status: AC
  Administered 2020-10-12: 2 g via INTRAVENOUS
  Filled 2020-10-12: qty 100

## 2020-10-12 MED ORDER — LACTATED RINGERS IV SOLN: INTRAVENOUS | Status: DC

## 2020-10-12 MED ORDER — LIDOCAINE 2% (20 MG/ML) 5 ML SYRINGE
INTRAMUSCULAR | Status: DC | PRN
  Administered 2020-10-12: 60 mg via INTRAVENOUS

## 2020-10-12 MED ORDER — KETOROLAC TROMETHAMINE 30 MG/ML IJ SOLN
INTRAMUSCULAR | Status: DC | PRN
  Administered 2020-10-12: 15 mg via INTRAVENOUS

## 2020-10-12 MED ORDER — DEXAMETHASONE SODIUM PHOSPHATE 10 MG/ML IJ SOLN
INTRAMUSCULAR | Status: AC
  Filled 2020-10-12: qty 1

## 2020-10-12 MED ORDER — CEPHALEXIN 500 MG PO CAPS: 500.0000 mg | ORAL_CAPSULE | Freq: Once | ORAL | 0 refills | Status: AC

## 2020-10-12 MED ORDER — EPHEDRINE 5 MG/ML INJ
INTRAVENOUS | Status: AC
  Filled 2020-10-12: qty 10

## 2020-10-12 MED ORDER — ACETAMINOPHEN 10 MG/ML IV SOLN: 1000.0000 mg | Freq: Once | INTRAVENOUS | Status: DC | PRN

## 2020-10-12 MED ORDER — FENTANYL CITRATE (PF) 100 MCG/2ML IJ SOLN
INTRAMUSCULAR | Status: DC | PRN
  Administered 2020-10-12 (×2): 25 ug via INTRAVENOUS
  Administered 2020-10-12: 50 ug via INTRAVENOUS

## 2020-10-12 MED ORDER — ONDANSETRON HCL 4 MG/2ML IJ SOLN
INTRAMUSCULAR | Status: AC
  Filled 2020-10-12: qty 2

## 2020-10-12 MED ORDER — PROMETHAZINE HCL 25 MG/ML IJ SOLN: 6.2500 mg | INTRAMUSCULAR | Status: DC | PRN

## 2020-10-12 MED ORDER — EPHEDRINE SULFATE 50 MG/ML IJ SOLN
INTRAMUSCULAR | Status: DC | PRN
  Administered 2020-10-12 (×2): 20 mg via INTRAVENOUS

## 2020-10-12 MED ORDER — OXYBUTYNIN CHLORIDE 5 MG PO TABS: 5.0000 mg | ORAL_TABLET | Freq: Three times a day (TID) | ORAL | 0 refills | Status: DC | PRN

## 2020-10-12 MED ORDER — PROPOFOL 10 MG/ML IV BOLUS
INTRAVENOUS | Status: AC
  Filled 2020-10-12: qty 40

## 2020-10-12 SURGICAL SUPPLY — 20 items
BAG DRAIN URO-CYSTO SKYTR STRL (DRAIN) ×2 IMPLANT
BAG DRN UROCATH (DRAIN) ×1
BASKET ZERO TIP NITINOL 2.4FR (BASKET) IMPLANT
BSKT STON RTRVL ZERO TP 2.4FR (BASKET)
CATH URET 5FR 28IN OPEN ENDED (CATHETERS) ×2 IMPLANT
CLOTH BEACON ORANGE TIMEOUT ST (SAFETY) ×2 IMPLANT
DRSG TEGADERM 4X4.75 (GAUZE/BANDAGES/DRESSINGS) IMPLANT
EXTRACTOR STONE 1.7FRX115CM (UROLOGICAL SUPPLIES) IMPLANT
GLOVE SURG ENC MOIS LTX SZ6.5 (GLOVE) ×2 IMPLANT
GOWN STRL REUS W/TWL LRG LVL3 (GOWN DISPOSABLE) ×2 IMPLANT
GUIDEWIRE STR DUAL SENSOR (WIRE) ×3 IMPLANT
IV NS IRRIG 3000ML ARTHROMATIC (IV SOLUTION) ×2 IMPLANT
KIT TURNOVER CYSTO (KITS) ×2 IMPLANT
MANIFOLD NEPTUNE II (INSTRUMENTS) ×2 IMPLANT
PACK CYSTO (CUSTOM PROCEDURE TRAY) ×2 IMPLANT
STENT URET 6FRX24 CONTOUR (STENTS) ×1 IMPLANT
TRACTIP FLEXIVA PULS ID 200XHI (Laser) IMPLANT
TRACTIP FLEXIVA PULSE ID 200 (Laser) ×2
TUBE CONNECTING 12X1/4 (SUCTIONS) ×2 IMPLANT
TUBING UROLOGY SET (TUBING) ×2 IMPLANT

## 2020-10-12 NOTE — Discharge Instructions (Addendum)
DISCHARGE INSTRUCTIONS FOR KIDNEY STONE/URETERAL STENT   MEDICATIONS:  1. Resume all your other meds from home  2. AZO over the counter can help with the burning/stinging when you urinate. 3. Tramadol is for moderate/severe pain, otherwise taking up to 1000 mg every 6 hours of plainTylenol will help treat your pain.   4. Take Cephalexin one hour prior to removal of your stent.  5.  Oxybuytnin and tamsulosin can help with stent discomfort   ACTIVITY:  1. No strenuous activity x 1week  2. No driving while on narcotic pain medications  3. Drink plenty of water  4. Continue to walk at home - you can still get blood clots when you are at home, so keep active, but don't over do it.  5. May return to work/school tomorrow or when you feel ready   BATHING:  1. You can shower and we recommend daily showers  2. You have a string coming from your urethra: The stent string is attached to your ureteral stent. Do not pull on this.   SIGNS/SYMPTOMS TO CALL:  Please call Victoria Strickland if you have a fever greater than 101.5, uncontrolled nausea/vomiting, uncontrolled pain, dizziness, unable to urinate, bloody urine, chest pain, shortness of breath, leg swelling, leg pain, redness around wound, drainage from wound, or any other concerns or questions.   You can reach Victoria Strickland at [phone].   FOLLOW-UP:  1. You have a string attached to your stent, you may remove it on Friday, July 15. To do this, pull the stringsuntil the stent is completely removed. You may feel an odd sensation in your back.     NO ADVIL, ALEVE, NAPROXEN, OR IBUPROFEN UNTIL AFTER 2:30 PM TODAY.    [facility] [phone] Post Ureteroscopy With or Without Stent Instructions  Definitions:  Ureter: The duct that transports urine from the kidney to the bladder. Stent:   A plastic hollow tube that is placed into the ureter, from the kidney to the bladder to prevent the ureter from swelling shut.  GENERAL  INSTRUCTIONS:  Despite the fact that no skin incisions were used, the area around the ureter and bladder is raw and irritated. The stent is a foreign body which will further irritate the bladder wall. This irritation is manifested by increased frequency of urination, both day and night, and by an increase in the urge to urinate. In some, the urge to urinate is present almost always. Sometimes the urge is strong enough that you may not be able to stop yourself from urinating. The only real cure is to remove the stent and then give time for the bladder wall to heal which can't be done until the danger of the ureter swelling shut has passed, which varies.  You may see some blood in your urine while the stent is in place and a few days afterwards. Do not be alarmed, even if the urine was clear for a while. Get off your feet and drink lots of fluids until clearing occurs. If you start to pass clots or don't improve, call Victoria Strickland.  DIET: You may return to your normal diet immediately. Because of the raw surface of your bladder, alcohol, spicy foods, acid type foods and drinks with caffeine may cause irritation or frequency and should be used in moderation. To keep your urine flowing freely and to avoid constipation, drink plenty of fluids during the day ( 8-10 glasses ). Tip: Avoid cranberry juice because it is very acidic.  ACTIVITY: Your physical activity doesn't need  to be restricted. However, if you are very active, you may see some blood in your urine. We suggest that you reduce your activity under these circumstances until the bleeding has stopped.  BOWELS: It is important to keep your bowels regular during the postoperative period. Straining with bowel movements can cause bleeding. A bowel movement every other day is reasonable. Use a mild laxative if needed, such as Milk of Magnesia 2-3 tablespoons, or 2 Dulcolax tablets. Call if you continue to have problems. If you have been taking narcotics for pain,  before, during or after your surgery, you may be constipated. Take a laxative if necessary.   MEDICATION: You should resume your pre-surgery medications unless told not to. In addition you will often be given an antibiotic to prevent infection. These should be taken as prescribed until the bottles are finished unless you are having an unusual reaction to one of the drugs.  PROBLEMS YOU SHOULD REPORT TO Victoria Strickland: Fevers over 100.5 Fahrenheit. Heavy bleeding, or clots ( See above notes about blood in urine ). Inability to urinate. Drug reactions ( hives, rash, nausea, vomiting, diarrhea ). Severe burning or pain with urination that is not improving.  FOLLOW-UP: You will need a follow-up appointment to monitor your progress. Call for this appointment at the number listed above. Usually the first appointment will be about three to fourteen days after your surgery.      Post Anesthesia Home Care Instructions  Activity: Get plenty of rest for the remainder of the day. A responsible individual must stay with you for 24 hours following the procedure.  For the next 24 hours, DO NOT: -Drive a car -Paediatric nurse -Drink alcoholic beverages -Take any medication unless instructed by your physician -Make any legal decisions or sign important papers.  Meals: Start with liquid foods such as gelatin or soup. Progress to regular foods as tolerated. Avoid greasy, spicy, heavy foods. If nausea and/or vomiting occur, drink only clear liquids until the nausea and/or vomiting subsides. Call your physician if vomiting continues.  Special Instructions/Symptoms: Your throat may feel dry or sore from the anesthesia or the breathing tube placed in your throat during surgery. If this causes discomfort, gargle with warm salt water. The discomfort should disappear within 24 hours.

## 2020-10-12 NOTE — Transfer of Care (Signed)
Immediate Anesthesia Transfer of Care Note  Patient: Victoria Strickland  Procedure(s) Performed: Procedure(s) (LRB): CYSTOSCOPY WITH RETROGRADE PYELOGRAM, URETEROSCOPY AND STENT PLACEMENT AND LASER LITHOTRIPSY (Right)  Patient Location: PACU  Anesthesia Type: General  Level of Consciousness: awake, sedated, patient cooperative and responds to stimulation  Airway & Oxygen Therapy: Patient Spontanous Breathing and Patient connected to [place] 02 and soft FM   Post-op Assessment: Report given to PACU RN, Post -op Vital signs reviewed and stable and Patient moving all extremities  Post vital signs: Reviewed and stable  Complications: No apparent anesthesia complications

## 2020-10-12 NOTE — Anesthesia Postprocedure Evaluation (Signed)
Anesthesia Post Note  Patient: Victoria Strickland  Procedure(s) Performed: CYSTOSCOPY WITH RETROGRADE PYELOGRAM, URETEROSCOPY AND STENT PLACEMENT AND LASER LITHOTRIPSY (Right: Ureter)     Patient location during evaluation: PACU Anesthesia Type: General Level of consciousness: awake and alert Pain management: pain level controlled Vital Signs Assessment: post-procedure vital signs reviewed and stable Respiratory status: spontaneous breathing, nonlabored ventilation, respiratory function stable and patient connected to nasal cannula oxygen Cardiovascular status: blood pressure returned to baseline and stable Postop Assessment: no apparent nausea or vomiting Anesthetic complications: no   No notable events documented.  Last Vitals:  Vitals:   10/12/20 0915 10/12/20 0945  BP: (!) 157/80 (!) 155/90  Pulse: (!) 57 (!) 53  Resp: 12 14  Temp:  36.4 C  SpO2: 100% 100%    Last Pain:  Vitals:   10/12/20 0945  TempSrc:   PainSc: 0-No pain                 Belenda Cruise P Ruwayda Curet

## 2020-10-12 NOTE — Op Note (Signed)
Preoperative diagnosis: right renal pelvis calculus  Postoperative diagnosis: right renal pelvis calculus  Procedure:  Cystoscopy right ureteroscopy, laser lithotripsy, basket stone extraction right 90F x 24 ureteral stent placement - with tether right retrograde pyelography with interpretation  Surgeon: Jacalyn Lefevre, MD  Anesthesia: General  Complications: None  Intraoperative findings:  Normal urethra Bilateral orthotropic ureteral orifices right retrograde pyelography demonstrated a filling defect within the right ureter consistent with the patient's known calculus without other abnormalities. Bladder mucosa normal without masses   EBL: Minimal  Specimens: right renal calculus  Disposition of specimens: [facility] for stone analysis  Indication: Victoria Strickland is a 60 y.o.   patient with a 79mm right renal stone and associated right symptoms. After reviewing the management options for treatment, the patient elected to proceed with the above surgical procedure(s). We have discussed the potential benefits and risks of the procedure, side effects of the proposed treatment, the likelihood of the patient achieving the goals of the procedure, and any potential problems that might occur during the procedure or recuperation. Informed consent has been obtained.   Description of procedure:  The patient was taken to the operating room and general anesthesia was induced.  The patient was placed in the dorsal lithotomy position, prepped and draped in the usual sterile fashion, and preoperative antibiotics were administered. A preoperative time-out was performed.   Cystourethroscopy was performed.  The patient's urethra was examined and was normal.  The bladder was then systematically examined in its entirety. There was no evidence for any bladder tumors, stones, or other mucosal pathology.    Attention then turned to the right ureteral orifice and a ureteral  catheter was used to intubate the ureteral orifice.  Omnipaque contrast was injected through the ureteral catheter and a retrograde pyelogram was performed with findings as dictated above.  A 0.38 sensor guidewire was then advanced up the right ureter into the renal pelvis under fluoroscopic guidance. The 4.5 Fr semirigid ureteroscope was then advanced into the ureter next to the guidewire and the calculus was identified.   The stone was then fragmented with the 200 micron holmium laser fiber.  The stone then migrated proximally and was not reachable by the semirigid ureteroscope.  A second wire was placed through the semirigid ureteroscope to the renal pelvis.  The ureteroscope was removed.  Attempts at placing ureteral access sheath were unsuccessful.  A flexible ureteroscope was then placed over the second wire and advanced to the kidney under direct visualization.  The wire was removed.  Laser lithotripsy commenced until the stone was dusted and all fragments were noted to be less than 1 mm in size.  A 0 tip basket was used to remove one of the larger fragments to send for stone analysis.  The ureter was examined as the ureteroscope was removed and no trauma or obstructing stones were noted.  A 6 French by 24 cm JJ stent with a tether was then placed over a wire with fluoroscopic guidance.   The wire was then removed with an adequate stent curl noted in the renal pelvis as well as in the bladder.  The bladder was then emptied and the procedure ended.  The patient appeared to tolerate the procedure well and without complications.  The patient was able to be awakened and transferred to the recovery unit in satisfactory condition.   Disposition: The tether of the stent was left on and tucked inside the patient's vagina.  Instructions for removing the stent have  been provided to the patient.

## 2020-10-12 NOTE — Anesthesia Procedure Notes (Signed)
Procedure Name: LMA Insertion Date/Time: 10/12/2020 7:47 AM Performed by: Justice Rocher, CRNA Pre-anesthesia Checklist: Patient identified, Emergency Drugs available, Suction available, Patient being monitored and Timeout performed Patient Re-evaluated:Patient Re-evaluated prior to induction Oxygen Delivery Method: Circle system utilized Preoxygenation: Pre-oxygenation with 100% oxygen Induction Type: IV induction Ventilation: Mask ventilation without difficulty LMA: LMA inserted LMA Size: 4.0 Number of attempts: 1 Airway Equipment and Method: Bite block Placement Confirmation: positive ETCO2, breath sounds checked- equal and bilateral and CO2 detector Tube secured with: Tape Dental Injury: Teeth and Oropharynx as per pre-operative assessment

## 2020-10-12 NOTE — Interval H&P Note (Signed)
History and Physical Interval Note: Patient has experienced dark urine lately despite drinking a lot of fluids.  No distinct gross hematuria.   10/12/2020 7:35 AM  Victoria Strickland  has presented today for surgery, with the diagnosis of RIGHT RENAL PELVIC CALCULUS.  The various methods of treatment have been discussed with the patient and family. After consideration of risks, benefits and other options for treatment, the patient has consented to  Procedure(s) with comments: CYSTOSCOPY WITH RETROGRADE PYELOGRAM, URETEROSCOPY AND STENT PLACEMENT (Right) - 1 HR as a surgical intervention.  The patient's history has been reviewed, patient examined, no change in status, stable for surgery.  I have reviewed the patient's chart and labs.  Questions were answered to the patient's satisfaction.     Edwardine Deschepper D Olga Bourbeau

## 2020-10-13 ENCOUNTER — Encounter (HOSPITAL_BASED_OUTPATIENT_CLINIC_OR_DEPARTMENT_OTHER): Payer: Self-pay | Admitting: Urology

## 2020-10-27 DIAGNOSIS — Z03818 Encounter for observation for suspected exposure to other biological agents ruled out: Secondary | ICD-10-CM | POA: Diagnosis not present

## 2020-11-08 DIAGNOSIS — N200 Calculus of kidney: Secondary | ICD-10-CM | POA: Diagnosis not present

## 2020-11-22 ENCOUNTER — Other Ambulatory Visit: Payer: Self-pay

## 2020-11-22 ENCOUNTER — Ambulatory Visit
Admission: RE | Admit: 2020-11-22 | Discharge: 2020-11-22 | Disposition: A | Discharge status: Home / Self Care | Payer: BC Managed Care – PPO | Source: Ambulatory Visit | Attending: Internal Medicine | Admitting: Internal Medicine

## 2020-11-22 DIAGNOSIS — Z1231 Encounter for screening mammogram for malignant neoplasm of breast: Secondary | ICD-10-CM

## 2021-01-17 ENCOUNTER — Other Ambulatory Visit: Payer: Self-pay | Admitting: Internal Medicine

## 2021-02-08 DIAGNOSIS — N898 Other specified noninflammatory disorders of vagina: Secondary | ICD-10-CM | POA: Diagnosis not present

## 2021-02-08 DIAGNOSIS — R102 Pelvic and perineal pain: Secondary | ICD-10-CM | POA: Diagnosis not present

## 2021-04-29 ENCOUNTER — Other Ambulatory Visit: Payer: Self-pay | Admitting: Internal Medicine

## 2021-05-11 ENCOUNTER — Other Ambulatory Visit: Payer: Self-pay | Admitting: Internal Medicine

## 2021-05-11 DIAGNOSIS — Z01419 Encounter for gynecological examination (general) (routine) without abnormal findings: Secondary | ICD-10-CM | POA: Diagnosis not present

## 2021-05-11 DIAGNOSIS — Z130 Encounter for screening for diseases of the blood and blood-forming organs and certain disorders involving the immune mechanism: Secondary | ICD-10-CM | POA: Diagnosis not present

## 2021-05-11 DIAGNOSIS — Z124 Encounter for screening for malignant neoplasm of cervix: Secondary | ICD-10-CM | POA: Diagnosis not present

## 2021-05-11 DIAGNOSIS — Z1151 Encounter for screening for human papillomavirus (HPV): Secondary | ICD-10-CM | POA: Diagnosis not present

## 2021-05-11 DIAGNOSIS — Z1231 Encounter for screening mammogram for malignant neoplasm of breast: Secondary | ICD-10-CM

## 2021-05-11 LAB — HM PAP SMEAR: HM Pap smear: NORMAL

## 2021-08-09 ENCOUNTER — Ambulatory Visit (INDEPENDENT_AMBULATORY_CARE_PROVIDER_SITE_OTHER): Payer: BC Managed Care – PPO | Admitting: Internal Medicine

## 2021-08-09 ENCOUNTER — Encounter: Payer: Self-pay | Admitting: Internal Medicine

## 2021-08-09 VITALS — BP 138/70 | HR 60 | Temp 98.0°F | Ht 63.6 in | Wt 162.4 lb

## 2021-08-09 DIAGNOSIS — R0789 Other chest pain: Secondary | ICD-10-CM | POA: Diagnosis not present

## 2021-08-09 DIAGNOSIS — I10 Essential (primary) hypertension: Secondary | ICD-10-CM | POA: Diagnosis not present

## 2021-08-09 DIAGNOSIS — N3941 Urge incontinence: Secondary | ICD-10-CM | POA: Diagnosis not present

## 2021-08-09 DIAGNOSIS — Z0000 Encounter for general adult medical examination without abnormal findings: Secondary | ICD-10-CM

## 2021-08-09 DIAGNOSIS — Z6828 Body mass index (BMI) 28.0-28.9, adult: Secondary | ICD-10-CM

## 2021-08-09 DIAGNOSIS — E7800 Pure hypercholesterolemia, unspecified: Secondary | ICD-10-CM | POA: Diagnosis not present

## 2021-08-09 LAB — POCT URINALYSIS DIPSTICK
Bilirubin, UA: NEGATIVE
Glucose, UA: NEGATIVE
Ketones, UA: NEGATIVE
Leukocytes, UA: NEGATIVE
Nitrite, UA: NEGATIVE
Protein, UA: NEGATIVE
Spec Grav, UA: 1.015 (ref 1.010–1.025)
Urobilinogen, UA: 0.2 E.U./dL
pH, UA: 6 (ref 5.0–8.0)

## 2021-08-09 MED ORDER — LISINOPRIL 5 MG PO TABS: 5.0000 mg | ORAL_TABLET | Freq: Every day | ORAL | 2 refills | Status: DC

## 2021-08-09 NOTE — Patient Instructions (Addendum)
Pelvic Floor Dysfunction, Female ? ?Pelvic floor dysfunction (PFD) is a condition that results when the group of muscles and connective tissues that support the organs in the pelvis (pelvic floor muscles) do not work well. These muscles and their connections form a sling that supports the colon and bladder. In women, they also support the uterus. ?PFD causes pelvic floor muscles to be too weak, too tight, or both. In PFD, muscle movements are not coordinated. This may cause bowel or bladder problems. It may also cause pain. ?What are the causes? ?This condition may be caused by an injury to the pelvic area or by a weakening of pelvic muscles. This often results from pregnancy and childbirth or other types of strain. In many cases, the exact cause is not known. ?What increases the risk? ?The following factors may make you more likely to develop this condition: ?Having chronic bladder tissue inflammation (interstitial cystitis). ?Being an older person. ?Being overweight. ?History of radiation treatment for cancer in the pelvic region. ?Previous pelvic surgery, such as removal of the uterus (hysterectomy). ?What are the signs or symptoms? ?Symptoms of this condition vary and may include: ?Bladder symptoms, such as: ?Trouble starting urination and emptying the bladder. ?Frequent urinary tract infections. ?Leaking urine when coughing, laughing, or exercising (stress incontinence). ?Having to pass urine urgently or frequently. ?Pain when passing urine. ?Bowel symptoms, such as: ?Constipation. ?Urgent or frequent bowel movements. ?Incomplete bowel movements. ?Painful bowel movements. ?Leaking stool or gas. ?Unexplained genital or rectal pain. ?Genital or rectal muscle spasms. ?Low back pain. ?Other symptoms may include: ?A heavy, full, or aching feeling in the vagina. ?A bulge that protrudes into the vagina. ?Pain during or after sex. ?How is this diagnosed? ?This condition may be diagnosed based on: ?Your symptoms and  medical history. ?A physical exam. During the exam, your health care provider may check your pelvic muscles for tightness, spasm, pain, or weakness. This may include a rectal exam and a pelvic exam. ?In some cases, you may have diagnostic tests, such as: ?Electrical muscle function tests. ?Urine flow testing. ?X-ray tests of bowel function. ?Ultrasound of the pelvic organs. ?How is this treated? ?Treatment for this condition depends on the symptoms. Treatment options include: ?Physical therapy. This may include Kegel exercises to help relax or strengthen the pelvic floor muscles. ?Biofeedback. This type of therapy provides feedback on how tight your pelvic floor muscles are so that you can learn to control them. ?Internal or external massage therapy. ?A treatment that involves electrical stimulation of the pelvic floor muscles to help control pain (transcutaneous electrical nerve stimulation, or TENS). ?Sound wave therapy (ultrasound) to reduce muscle spasms. ?Medicines, such as: ?Muscle relaxants. ?Bladder control medicines. ?Surgery to reconstruct or support pelvic floor muscles may be an option if other treatments do not help. ?Follow these instructions at home: ?Activity ?Do your usual activities as told by your health care provider. Ask your health care provider if you should modify any activities. ?Do pelvic floor strengthening or relaxing exercises at home as told by your physical therapist. ?Lifestyle ?Maintain a healthy weight. ?Eat foods that are high in fiber, such as beans, whole grains, and fresh fruits and vegetables. ?Limit foods that are high in fat and processed sugars, such as fried or sweet foods. ?Manage stress with relaxation techniques such as yoga or meditation. ?General instructions ?If you have problems with leakage: ?Use absorbable pads or wear padded underwear. ?Wash frequently with mild soap. ?Keep your genital and anal area as clean and dry as  possible. ?Ask your health care provider if  you should try a barrier cream to prevent skin irritation. ?Take warm baths to relieve pelvic muscle tension or spasms. ?Take over-the-counter and prescription medicines only as told by your health care provider. ?Keep all follow-up visits. ?How is this prevented? ?The cause of PFD is not always known, but there are a few things you can do to reduce the risk of developing this condition, including: ?Staying at a healthy weight. ?Getting regular exercise. ?Managing stress. ?Contact a health care provider if: ?Your symptoms are not improving with home care. ?You have signs or symptoms of PFD that get worse at home. ?You develop new signs or symptoms. ?You have signs of a urinary tract infection, such as: ?Fever. ?Chills. ?Increased urinary frequency. ?A burning feeling when urinating. ?You have not had a bowel movement in 3 days (constipation). ?Summary ?Pelvic floor dysfunction results when the muscles and connective tissues in your pelvic floor do not work well. ?These muscles and their connections form a sling that supports your colon and bladder. In women, they also support the uterus. ?PFD may be caused by an injury to the pelvic area or by a weakening of pelvic muscles. ?PFD causes pelvic floor muscles to be too weak, too tight, or a combination of both. Symptoms may vary from person to person. ?In most cases, PFD can be treated with physical therapies and medicines. Surgery may be an option if other treatments do not help. ?This information is not intended to replace advice given to you by your health care provider. Make sure you discuss any questions you have with your health care provider. ?Document Revised: 07/28/2020 Document Reviewed: 07/28/2020 ?Elsevier Patient Education ? [place]. ? ? ?Health Maintenance, Female ?Adopting a healthy lifestyle and getting preventive care are important in promoting health and wellness. Ask your health care provider about: ?The right schedule for you to have  regular tests and exams. ?Things you can do on your own to prevent diseases and keep yourself healthy. ?What should I know about diet, weight, and exercise? ?Eat a healthy diet ? ?Eat a diet that includes plenty of vegetables, fruits, low-fat dairy products, and lean protein. ?Do not eat a lot of foods that are high in solid fats, added sugars, or sodium. ?Maintain a healthy weight ?Body mass index (BMI) is used to identify weight problems. It estimates body fat based on height and weight. Your health care provider can help determine your BMI and help you achieve or maintain a healthy weight. ?Get regular exercise ?Get regular exercise. This is one of the most important things you can do for your health. Most adults should: ?Exercise for at least 150 minutes each week. The exercise should increase your heart rate and make you sweat (moderate-intensity exercise). ?Do strengthening exercises at least twice a week. This is in addition to the moderate-intensity exercise. ?Spend less time sitting. Even light physical activity can be beneficial. ?Watch cholesterol and blood lipids ?Have your blood tested for lipids and cholesterol at 62 years of age, then have this test every 5 years. ?Have your cholesterol levels checked more often if: ?Your lipid or cholesterol levels are high. ?You are older than 62 years of age. ?You are at high risk for heart disease. ?What should I know about cancer screening? ?Depending on your health history and family history, you may need to have cancer screening at various ages. This may include screening for: ?Breast cancer. ?Cervical cancer. ?Colorectal cancer. ?Skin cancer. ?Lung cancer. ?  What should I know about heart disease, diabetes, and high blood pressure? ?Blood pressure and heart disease ?High blood pressure causes heart disease and increases the risk of stroke. This is more likely to develop in people who have high blood pressure readings or are overweight. ?Have your blood pressure  checked: ?Every 3-5 years if you are 30-39 years of age. ?Every year if you are 16 years old or older. ?Diabetes ?Have regular diabetes screenings. This checks your fasting blood sugar level. Have the screen

## 2021-08-09 NOTE — Progress Notes (Signed)
?Rich Brave Llittleton,acting as a Education administrator for Maximino Greenland, MD.,have documented all relevant documentation on the behalf of Maximino Greenland, MD,as directed by  Maximino Greenland, MD while in the presence of Maximino Greenland, MD.  ?This visit occurred during the SARS-CoV-2 public health emergency.  Safety protocols were in place, including screening questions prior to the visit, additional usage of staff PPE, and extensive cleaning of exam room while observing appropriate contact time as indicated for disinfecting solutions. ? ?Subjective:  ?  ? Patient ID: Victoria Strickland , female    DOB: 23-Jun-1960 , 61 y.o.   MRN: [id number] ? ? ?Chief Complaint  ?Patient presents with  ? Annual Exam  ? ? ?HPI ? ?She is here today for a full physical exam. She is followed by Dr. Marvel Plan, GYN for her pelvic exams. She had a pap done on 05/11/2021. She is no longer working for [employer]. 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 She reports having an active job. She has no specific concerns or complaints at this time.  ? ? ?Hypertension ?This is a chronic problem. The current episode started more than 1 year ago. The problem has been gradually improving since onset. The problem is controlled. Pertinent negatives include no blurred vision, chest pain, palpitations or shortness of breath. Risk factors for coronary artery disease include post-menopausal state. The current treatment provides moderate improvement. There is no history of kidney disease or CAD/MI.   ? ?Past Medical History:  ?Diagnosis Date  ? COVID 05/2020  ? mild symptoms x few days all symptoms resolved  ? Essential hypertension, benign   ? Hematuria 08/27/2020  ? pt in er, issue resolved as of 10-07-2020  ? Hot flashes 10/07/2020  ? Hyperlipidemia   ? Pneumonia   ? yrs ago per pt on 10-07-2020  ? Right renal stone 10/07/2020  ? Wears dentures 10/07/2020  ? upper  ? Wears glasses 10/07/2020  ?  ? ?Family History  ?Problem Relation Age of Onset  ? Fibroids Mother   ? Cancer  Mother   ?     unknown  ? Aneurysm Father   ? Cancer Other   ?     maternal  ? Breast cancer Other   ? Breast cancer Cousin   ?     maternal side  ? ? ? ?Current Outpatient Medications:  ?  atorvastatin (LIPITOR) 80 MG tablet, TAKE 1 TABLET(80 MG) BY MOUTH DAILY, Disp: 90 tablet, Rfl: 1 ?  loratadine (ALLERGY) 10 MG tablet, Take 10 mg by mouth daily., Disp: , Rfl:  ?  Multiple Vitamin (MULTIVITAMIN) LIQD, Take 5 mLs by mouth daily., Disp: , Rfl:  ?  OVER THE COUNTER MEDICATION, Liquid vitamin c Advance immunity support, Disp: , Rfl:  ?  OVER THE COUNTER MEDICATION, 5,000 mcg. Liquid vitamin b12, Disp: , Rfl:  ?  OVER THE COUNTER MEDICATION, Vitamin d 3 liquid daily, Disp: , Rfl:  ?  lisinopril (ZESTRIL) 5 MG tablet, Take 1 tablet (5 mg total) by mouth daily., Disp: 90 tablet, Rfl: 2  ? ?No Known Allergies  ? ? ?The patient states she uses post menopausal status for birth control. Last LMP was No LMP recorded. Patient is postmenopausal.. Negative for Dysmenorrhea. Negative for: breast discharge, breast lump(s), breast pain and breast self exam. Associated symptoms include abnormal vaginal bleeding. Pertinent negatives include abnormal bleeding (hematology), anxiety, decreased libido, depression, difficulty falling sleep, dyspareunia, history of infertility, nocturia, sexual dysfunction, sleep disturbances, urinary incontinence,  urinary urgency, vaginal discharge and vaginal itching. Diet regular.The patient states her exercise level is  MODERATE. ? . The patient's tobacco use is:  ?Social History  ? ?Tobacco Use  ?Smoking Status Never  ?Smokeless Tobacco Never  ?Marland Kitchen She has been exposed to passive smoke. The patient's alcohol use is:  ?Social History  ? ?Substance and Sexual Activity  ?Alcohol Use Never  ? ? ?Review of Systems  ?Constitutional: Negative.   ?HENT: Negative.    ?Eyes: Negative.  Negative for blurred vision.  ?Respiratory: Negative.  Negative for shortness of breath.   ?Cardiovascular: Negative.  Negative  for chest pain and palpitations.  ?Gastrointestinal: Negative.   ?Endocrine: Negative.   ?Genitourinary:  Positive for urgency.  ?Musculoskeletal: Negative.   ?     She c/o chest wall tenderness. She does yoga in mornings. There is some strength training as well. Denies fall/trauma.  Pain is exacerbated by positional changes. Denies associated sob, diaphoresis, n/v, palpitations.   ?Skin: Negative.   ?Allergic/Immunologic: Negative.   ?Neurological: Negative.   ?Hematological: Negative.   ?Psychiatric/Behavioral: Negative.     ? ?Today's Vitals  ? 08/09/21 0900  ?BP: 138/70  ?Pulse: 60  ?Temp: 98 ?F (36.7 ?C)  ?Weight: 162 lb 6.4 oz (73.7 kg)  ?Height: 5' 3.6" (1.615 m)  ?PainSc: 0-No pain  ? ?Body mass index is 28.23 kg/m?.  ?Wt Readings from Last 3 Encounters:  ?08/09/21 162 lb 6.4 oz (73.7 kg)  ?10/12/20 157 lb 11.2 oz (71.5 kg)  ?08/27/20 145 lb (65.8 kg)  ?  ? ?Objective:  ?Physical Exam ?Vitals and nursing note reviewed.  ?Constitutional:   ?   Appearance: Normal appearance.  ?HENT:  ?   Head: Normocephalic and atraumatic.  ?   Right Ear: Tympanic membrane, ear canal and external ear normal.  ?   Left Ear: Tympanic membrane, ear canal and external ear normal.  ?   Nose: Nose normal.  ?   Mouth/Throat:  ?   Mouth: Mucous membranes are moist.  ?   Pharynx: Oropharynx is clear.  ?Eyes:  ?   Extraocular Movements: Extraocular movements intact.  ?   Conjunctiva/sclera: Conjunctivae normal.  ?   Pupils: Pupils are equal, round, and reactive to light.  ?Cardiovascular:  ?   Rate and Rhythm: Normal rate and regular rhythm.  ?   Pulses: Normal pulses.  ?   Heart sounds: Normal heart sounds.  ?Pulmonary:  ?   Effort: Pulmonary effort is normal.  ?   Breath sounds: Normal breath sounds.  ?Chest:  ?Breasts: ?   Tanner Score is 5.  ?   Right: Normal.  ?   Left: Normal.  ?   Comments: Anterior chest wall tenderness to deep palpation ?Abdominal:  ?   General: Abdomen is flat. Bowel sounds are normal.  ?   Palpations: Abdomen  is soft.  ?Genitourinary: ?   Comments: deferred ?Musculoskeletal:     ?   General: Normal range of motion.  ?   Cervical back: Normal range of motion and neck supple.  ?Skin: ?   General: Skin is warm and dry.  ?Neurological:  ?   General: No focal deficit present.  ?   Mental Status: She is alert and oriented to person, place, and time.  ?Psychiatric:     ?   Mood and Affect: Mood normal.     ?   Behavior: Behavior normal.  ?    ?Assessment And Plan:  ?   ?1.  Routine general medical examination at health care facility ?Comments: A full exam was performed. Importance of monthly self breast exams was discussed with the patient. PATIENT IS ADVISED TO GET 30-45 MINUTES REGULAR EXERCISE NO LESS THAN FOUR TO FIVE DAYS PER WEEK - BOTH WEIGHTBEARING EXERCISES AND AEROBIC ARE RECOMMENDED.  PATIENT IS ADVISED TO FOLLOW A HEALTHY DIET WITH AT LEAST SIX FRUITS/VEGGIES PER DAY, DECREASE INTAKE OF RED MEAT, AND TO INCREASE FISH INTAKE TO TWO DAYS PER WEEK.  MEATS/FISH SHOULD NOT BE FRIED, BAKED OR BROILED IS PREFERABLE.  IT IS ALSO IMPORTANT TO CUT BACK ON YOUR SUGAR INTAKE. PLEASE AVOID ANYTHING WITH ADDED SUGAR, CORN SYRUP OR OTHER SWEETENERS. IF YOU MUST USE A SWEETENER, YOU CAN TRY STEVIA. IT IS ALSO IMPORTANT TO AVOID ARTIFICIALLY SWEETENERS AND DIET BEVERAGES. LASTLY, I SUGGEST WEARING SPF 50 SUNSCREEN ON EXPOSED PARTS AND ESPECIALLY WHEN IN THE DIRECT SUNLIGHT FOR AN EXTENDED PERIOD OF TIME.  PLEASE AVOID FAST FOOD RESTAURANTS AND INCREASE YOUR WATER INTAKE. ? ?- CBC ?- CMP14+EGFR ?- Lipid panel ?- Hemoglobin A1c ?- Insulin, random(561) ? ?2. Essential hypertension, benign ?Comments: Chronic, fair control.  Goal BP<130/80. No med changes today. EKG performed, NSR w/ nonspecific T abnormality. Denies cardiac sx. She will f/u in six months.  ?- POCT Urinalysis Dipstick (81002) ?- Microalbumin / Creatinine Urine Ratio ?- EKG 12-Lead ?- lisinopril (ZESTRIL) 5 MG tablet; Take 1 tablet (5 mg total) by mouth daily.  Dispense:  90 tablet; Refill: 2 ? ?3. Pure hypercholesterolemia ?Comments: Cardiac score d/w patient. It is listed below. I will recheck lipid panel today, we also discussed utility of cardiac calcium score.  ?T

## 2021-08-10 LAB — HEMOGLOBIN A1C
Est. average glucose Bld gHb Est-mCnc: 117 mg/dL
Hgb A1c MFr Bld: 5.7 % — ABNORMAL HIGH (ref 4.8–5.6)

## 2021-08-10 LAB — CMP14+EGFR
ALT: 36 IU/L — ABNORMAL HIGH (ref 0–32)
AST: 33 IU/L (ref 0–40)
Albumin/Globulin Ratio: 1.7 (ref 1.2–2.2)
Albumin: 4.8 g/dL (ref 3.8–4.8)
Alkaline Phosphatase: 92 IU/L (ref 44–121)
BUN/Creatinine Ratio: 11 — ABNORMAL LOW (ref 12–28)
BUN: 11 mg/dL (ref 8–27)
Bilirubin Total: 0.6 mg/dL (ref 0.0–1.2)
CO2: 25 mmol/L (ref 20–29)
Calcium: 10.2 mg/dL (ref 8.7–10.3)
Chloride: 103 mmol/L (ref 96–106)
Creatinine, Ser: 1.01 mg/dL — ABNORMAL HIGH (ref 0.57–1.00)
Globulin, Total: 2.9 g/dL (ref 1.5–4.5)
Glucose: 81 mg/dL (ref 70–99)
Potassium: 3.9 mmol/L (ref 3.5–5.2)
Sodium: 143 mmol/L (ref 134–144)
Total Protein: 7.7 g/dL (ref 6.0–8.5)
eGFR: 63 mL/min/{1.73_m2} (ref >59)

## 2021-08-10 LAB — INSULIN, RANDOM: INSULIN: 14 u[IU]/mL (ref 2.6–24.9)

## 2021-08-10 LAB — CBC
Hematocrit: 37.8 % (ref 34.0–46.6)
Hemoglobin: 12.2 g/dL (ref 11.1–15.9)
MCH: 26.2 pg — ABNORMAL LOW (ref 26.6–33.0)
MCHC: 32.3 g/dL (ref 31.5–35.7)
MCV: 81 fL (ref 79–97)
Platelets: 223 10*3/uL (ref 150–450)
RBC: 4.66 x10E6/uL (ref 3.77–5.28)
RDW: 13.1 % (ref 11.7–15.4)
WBC: 4.8 10*3/uL (ref 3.4–10.8)

## 2021-08-10 LAB — MICROALBUMIN / CREATININE URINE RATIO
Creatinine, Urine: 87.6 mg/dL
Microalb/Creat Ratio: 7 mg/g creat (ref 0–29)
Microalbumin, Urine: 5.7 ug/mL

## 2021-08-10 LAB — LIPID PANEL
Chol/HDL Ratio: 3.2 ratio (ref 0.0–4.4)
Cholesterol, Total: 161 mg/dL (ref 100–199)
HDL: 50 mg/dL (ref >39)
LDL Chol Calc (NIH): 90 mg/dL (ref 0–99)
Triglycerides: 117 mg/dL (ref 0–149)
VLDL Cholesterol Cal: 21 mg/dL (ref 5–40)

## 2021-09-13 ENCOUNTER — Other Ambulatory Visit: Payer: Self-pay | Admitting: Internal Medicine

## 2021-09-13 ENCOUNTER — Encounter: Payer: Self-pay | Admitting: Internal Medicine

## 2021-09-13 DIAGNOSIS — I10 Essential (primary) hypertension: Secondary | ICD-10-CM

## 2021-09-13 DIAGNOSIS — E7800 Pure hypercholesterolemia, unspecified: Secondary | ICD-10-CM

## 2021-10-20 IMAGING — MG MM DIGITAL DIAGNOSTIC UNILAT*R* W/ TOMO W/ CAD
4 series · 4 of 12 positions shown · non-contrast
Comparison: Previous exam(s).

CLINICAL DATA: Possible asymmetry in the outer central right breast
on a recent screening mammogram.

EXAM:
DIGITAL DIAGNOSTIC UNILATERAL RIGHT MAMMOGRAM WITH TOMO AND CAD

[R CC synth-2D]
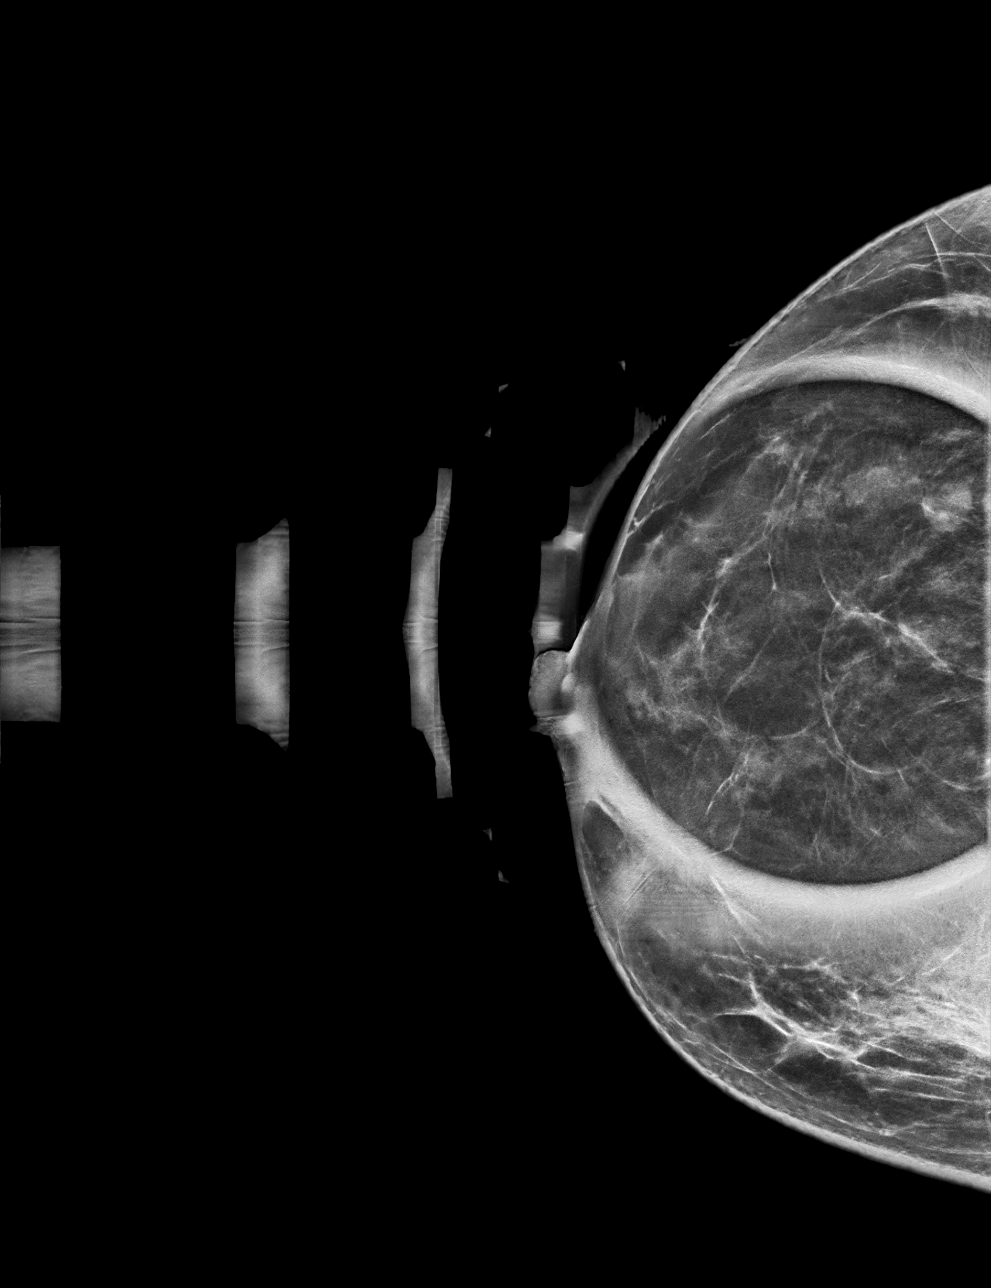

[R MLO synth-2D]
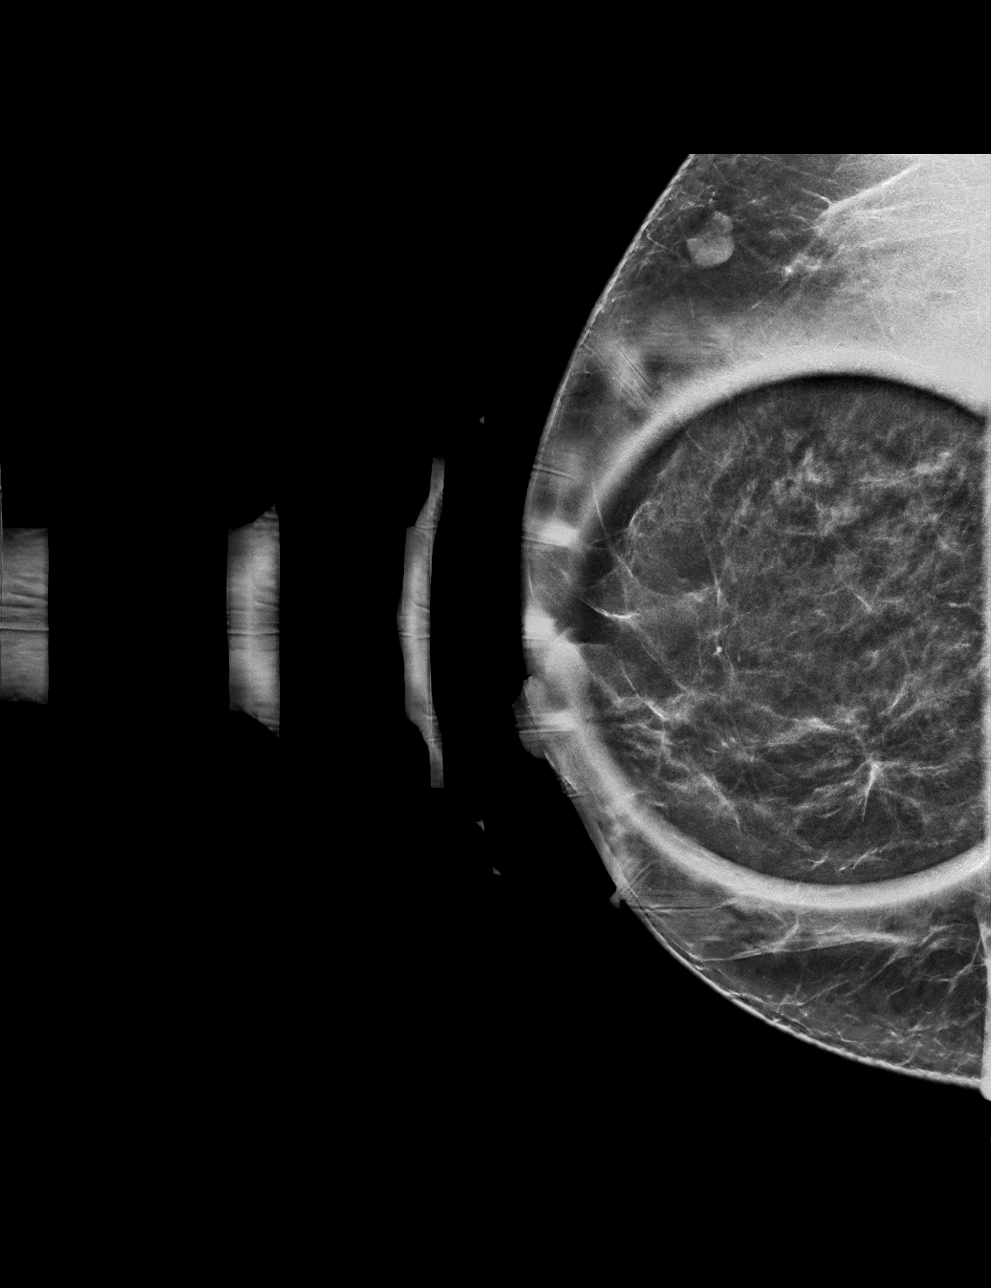

[R CC tomo · tomo slice 30/59.0]
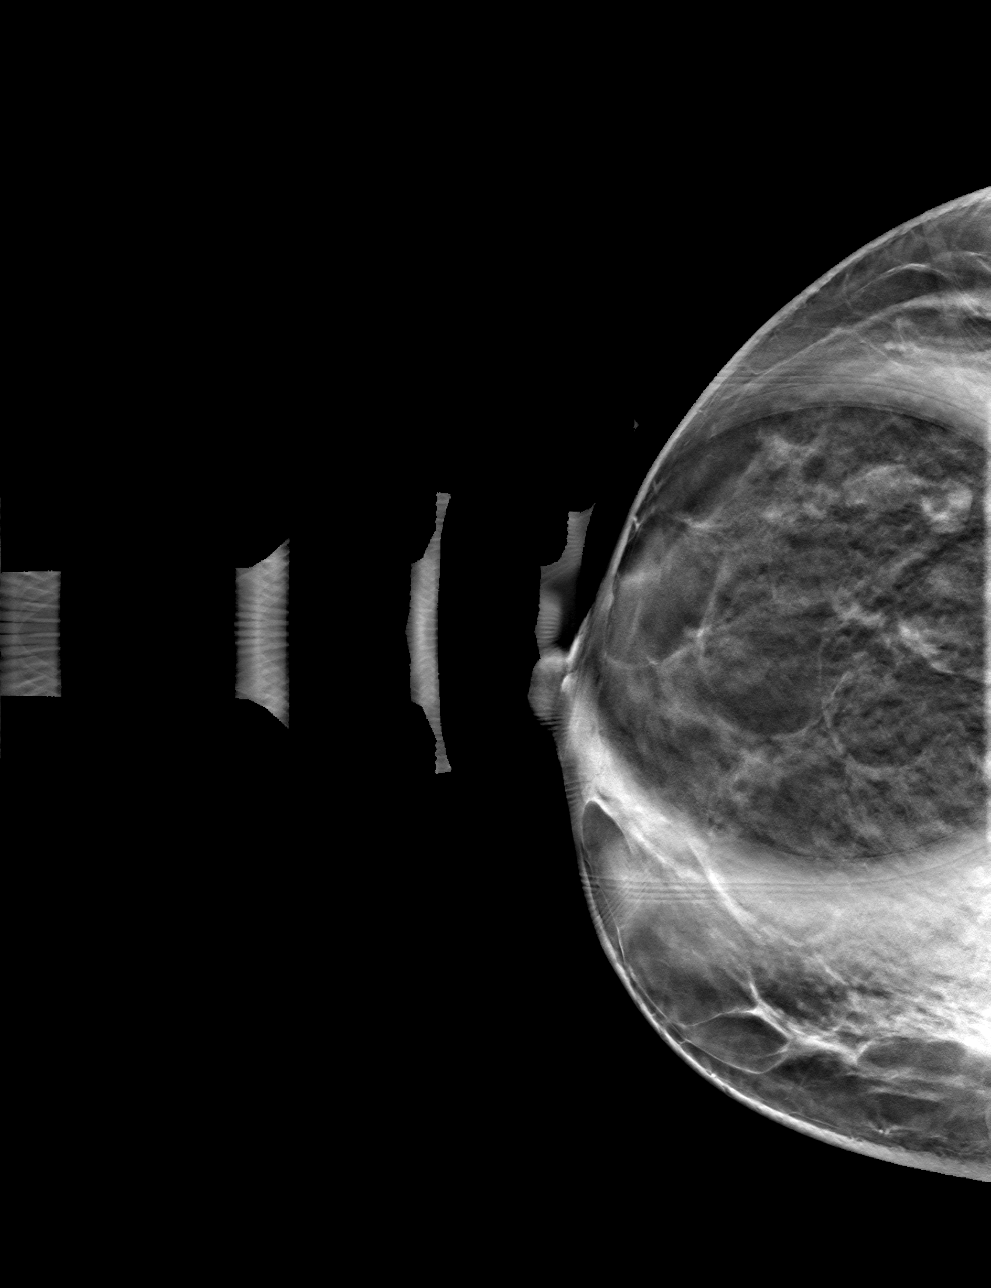

[R MLO tomo · tomo slice 33/65.0]
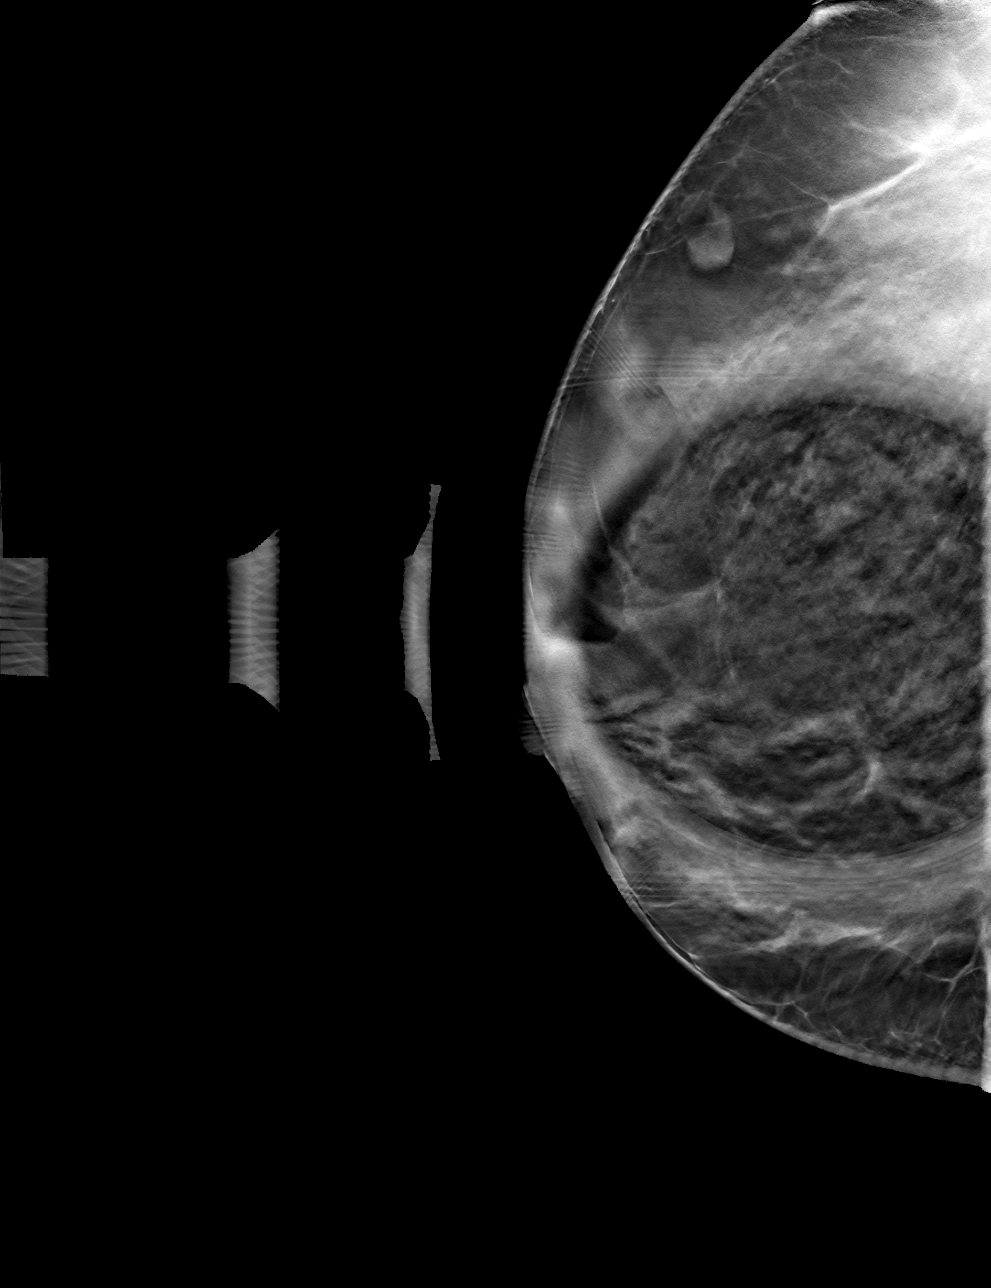

[4 of 12 positions shown; findings below may reference images not displayed]

ACR Breast Density Category c: The breast tissue is heterogeneously
dense, which may obscure small masses.
FINDINGS: 3D tomographic and 2D generated spot compression views of the right
breast demonstrate normal appearing fibroglandular tissue at the
location of recently suspected asymmetry, unchanged compared to
previous examinations.

Mammographic images were processed with CAD.
IMPRESSION: No evidence of malignancy. The recently suspected right breast
asymmetry was close apposition of normal breast tissue.

RECOMMENDATION:
Bilateral screening mammogram in 1 year.

I have discussed the findings and recommendations with the patient.
If applicable, a reminder letter will be sent to the patient
regarding the next appointment.

BI-RADS CATEGORY  1: Negative.

## 2021-11-23 ENCOUNTER — Ambulatory Visit
Admission: RE | Admit: 2021-11-23 | Discharge: 2021-11-23 | Disposition: A | Discharge status: Home / Self Care | Payer: BC Managed Care – PPO | Source: Ambulatory Visit | Attending: Internal Medicine | Admitting: Internal Medicine

## 2021-11-23 DIAGNOSIS — Z1231 Encounter for screening mammogram for malignant neoplasm of breast: Secondary | ICD-10-CM | POA: Diagnosis not present

## 2021-11-29 ENCOUNTER — Ambulatory Visit: Payer: BC Managed Care – PPO | Admitting: Skilled Nursing Facility1

## 2022-02-09 ENCOUNTER — Ambulatory Visit: Payer: BC Managed Care – PPO | Admitting: Internal Medicine

## 2022-02-22 ENCOUNTER — Encounter: Payer: Self-pay | Admitting: Internal Medicine

## 2022-02-22 ENCOUNTER — Ambulatory Visit (INDEPENDENT_AMBULATORY_CARE_PROVIDER_SITE_OTHER): Payer: BC Managed Care – PPO | Admitting: Internal Medicine

## 2022-02-22 ENCOUNTER — Ambulatory Visit: Payer: BC Managed Care – PPO | Admitting: Internal Medicine

## 2022-02-22 VITALS — BP 138/82 | HR 57 | Temp 98.2°F | Ht 63.0 in | Wt 155.0 lb

## 2022-02-22 DIAGNOSIS — Z23 Encounter for immunization: Secondary | ICD-10-CM | POA: Diagnosis not present

## 2022-02-22 DIAGNOSIS — R7309 Other abnormal glucose: Secondary | ICD-10-CM

## 2022-02-22 DIAGNOSIS — E7800 Pure hypercholesterolemia, unspecified: Secondary | ICD-10-CM | POA: Diagnosis not present

## 2022-02-22 DIAGNOSIS — M25511 Pain in right shoulder: Secondary | ICD-10-CM

## 2022-02-22 DIAGNOSIS — E559 Vitamin D deficiency, unspecified: Secondary | ICD-10-CM | POA: Diagnosis not present

## 2022-02-22 DIAGNOSIS — I10 Essential (primary) hypertension: Secondary | ICD-10-CM

## 2022-02-22 DIAGNOSIS — R945 Abnormal results of liver function studies: Secondary | ICD-10-CM | POA: Diagnosis not present

## 2022-02-22 DIAGNOSIS — M25512 Pain in left shoulder: Secondary | ICD-10-CM

## 2022-02-22 NOTE — Patient Instructions (Addendum)
The 10-year ASCVD risk score (Arnett DK, et al., 2019) is: 8%   Values used to calculate the score:     Age: 61 years     Sex: Female     Is Non-Hispanic African American: Yes     Diabetic: No     Tobacco smoker: No     Systolic Blood Pressure: 536 mmHg     Is BP treated: Yes     HDL Cholesterol: 50 mg/dL     Total Cholesterol: 161 mg/dL  Mediterranean Diet A Mediterranean diet refers to food and lifestyle choices that are based on the traditions of countries located on the [employer]. It focuses on eating more fruits, vegetables, whole grains, beans, nuts, seeds, and heart-healthy fats, and eating less dairy, meat, eggs, and processed foods with added sugar, salt, and fat. This way of eating has been shown to help prevent certain conditions and improve outcomes for people who have chronic diseases, like kidney disease and heart disease. What are tips for following this plan? Reading food labels Check the serving size of packaged foods. For foods such as rice and pasta, the serving size refers to the amount of cooked product, not dry. Check the total fat in packaged foods. Avoid foods that have saturated fat or trans fats. Check the ingredient list for added sugars, such as corn syrup. Shopping  Buy a variety of foods that offer a balanced diet, including: Fresh fruits and vegetables (produce). Grains, beans, nuts, and seeds. Some of these may be available in unpackaged forms or large amounts (in bulk). Fresh seafood. Poultry and eggs. Low-fat dairy products. Buy whole ingredients instead of prepackaged foods. Buy fresh fruits and vegetables in-season from local farmers markets. Buy plain frozen fruits and vegetables. If you do not have access to quality fresh seafood, buy precooked frozen shrimp or canned fish, such as tuna, salmon, or sardines. Stock your pantry so you always have certain foods on hand, such as olive oil, canned tuna, canned tomatoes, rice, pasta, and  beans. Cooking Cook foods with extra-virgin olive oil instead of using butter or other vegetable oils. Have meat as a side dish, and have vegetables or grains as your main dish. This means having meat in small portions or adding small amounts of meat to foods like pasta or stew. Use beans or vegetables instead of meat in common dishes like chili or lasagna. Experiment with different cooking methods. Try roasting, broiling, steaming, and sauting vegetables. Add frozen vegetables to soups, stews, pasta, or rice. Add nuts or seeds for added healthy fats and plant protein at each meal. You can add these to yogurt, salads, or vegetable dishes. Marinate fish or vegetables using olive oil, lemon juice, garlic, and fresh herbs. Meal planning Plan to eat one vegetarian meal one day each week. Try to work up to two vegetarian meals, if possible. Eat seafood two or more times a week. Have healthy snacks readily available, such as: Vegetable sticks with hummus. Greek yogurt. Fruit and nut trail mix. Eat balanced meals throughout the week. This includes: Fruit: 2-3 servings a day. Vegetables: 4-5 servings a day. Low-fat dairy: 2 servings a day. Fish, poultry, or lean meat: 1 serving a day. Beans and legumes: 2 or more servings a week. Nuts and seeds: 1-2 servings a day. Whole grains: 6-8 servings a day. Extra-virgin olive oil: 3-4 servings a day. Limit red meat and sweets to only a few servings a month. Lifestyle  Cook and eat meals together with your  family, when possible. Drink enough fluid to keep your urine pale yellow. Be physically active every day. This includes: Aerobic exercise like running or swimming. Leisure activities like gardening, walking, or housework. Get 7-8 hours of sleep each night. If recommended by your health care provider, drink red wine in moderation. This means 1 glass a day for nonpregnant women and 2 glasses a day for men. A glass of wine equals 5 oz (150 mL). What  foods should I eat? Fruits Apples. Apricots. Avocado. Berries. Bananas. Cherries. Dates. Figs. Grapes. Lemons. Melon. Oranges. Peaches. Plums. Pomegranate. Vegetables Artichokes. Beets. Broccoli. Cabbage. Carrots. Eggplant. Green beans. Chard. Kale. Spinach. Onions. Leeks. Peas. Squash. Tomatoes. Peppers. Radishes. Grains Whole-grain pasta. Brown rice. Bulgur wheat. Polenta. Couscous. Whole-wheat bread. [place]. Meats and other proteins Beans. Almonds. Sunflower seeds. Pine nuts. Peanuts. Biron. Salmon. Scallops. Shrimp. [place]. Tilapia. Clams. Oysters. Eggs. Poultry without skin. Dairy Low-fat milk. Cheese. Greek yogurt. Fats and oils Extra-virgin olive oil. Avocado oil. Grapeseed oil. Beverages Water. Red wine. Herbal tea. Sweets and desserts Greek yogurt with honey. Baked apples. Poached pears. Trail mix. Seasonings and condiments Basil. Cilantro. Coriander. Cumin. Mint. Parsley. Sage. Rosemary. Tarragon. Garlic. Oregano. Thyme. Pepper. Balsamic vinegar. Tahini. Hummus. Tomato sauce. Olives. Mushrooms. The items listed above may not be a complete list of foods and beverages you can eat. Contact a dietitian for more information. What foods should I limit? This is a list of foods that should be eaten rarely or only on special occasions. Fruits Fruit canned in syrup. Vegetables Deep-fried potatoes (french fries). Grains Prepackaged pasta or rice dishes. Prepackaged cereal with added sugar. Prepackaged snacks with added sugar. Meats and other proteins Beef. Pork. Lamb. Poultry with skin. Hot dogs. Berniece Salines. Dairy Ice cream. Sour cream. Whole milk. Fats and oils Butter. Canola oil. Vegetable oil. Beef fat (tallow). Lard. Beverages Juice. Sugar-sweetened soft drinks. Beer. Liquor and spirits. Sweets and desserts Cookies. Cakes. Pies. Candy. Seasonings and condiments Mayonnaise. Pre-made sauces and marinades. The items listed above may not be a complete list of foods and  beverages you should limit. Contact a dietitian for more information. Summary The Mediterranean diet includes both food and lifestyle choices. Eat a variety of fresh fruits and vegetables, beans, nuts, seeds, and whole grains. Limit the amount of red meat and sweets that you eat. If recommended by your health care provider, drink red wine in moderation. This means 1 glass a day for nonpregnant women and 2 glasses a day for men. A glass of wine equals 5 oz (150 mL). This information is not intended to replace advice given to you by your health care provider. Make sure you discuss any questions you have with your health care provider. Document Revised: 04/25/2019 Document Reviewed: 02/20/2019 Elsevier Patient Education  [place].

## 2022-02-22 NOTE — Progress Notes (Signed)
Barnet Glasgow Martin,acting as a Education administrator for Maximino Greenland, MD.,have documented all relevant documentation on the behalf of Maximino Greenland, MD,as directed by  Maximino Greenland, MD while in the presence of Maximino Greenland, MD.    Subjective:     Patient ID: Victoria Strickland , female    DOB: 08-19-1960 , 61 y.o.   MRN: [id number]   Chief Complaint  Patient presents with   Hypertension    HPI  Patient presents today for BP and Cholesterol check. Patient reports compliance with medications and has no other concerns today.  She denies having any headaches, chest pain and shortness of breath.   BP Readings from Last 3 Encounters: 02/22/22 : 138/82 08/09/21 : 138/70 10/12/20 : (!) 155/90    Hypertension This is a chronic problem. The current episode started more than 1 year ago. The problem has been gradually improving since onset. Pertinent negatives include no blurred vision, chest pain, palpitations or shortness of breath. Risk factors for coronary artery disease include dyslipidemia and post-menopausal state. Past treatments include ACE inhibitors.     Past Medical History:  Diagnosis Date   COVID 05/2020   mild symptoms x few days all symptoms resolved   Essential hypertension, benign    Hematuria 08/27/2020   pt in er, issue resolved as of 10-07-2020   Hot flashes 10/07/2020   Hyperlipidemia    Pneumonia    yrs ago per pt on 10-07-2020   Right renal stone 10/07/2020   Wears dentures 10/07/2020   upper   Wears glasses 10/07/2020     Family History  Problem Relation Age of Onset   Fibroids Mother    Cancer Mother        unknown   Aneurysm Father    Cancer Other        maternal   Breast cancer Other    Breast cancer Cousin        maternal side     Current Outpatient Medications:    atorvastatin (LIPITOR) 80 MG tablet, TAKE 1 TABLET(80 MG) BY MOUTH DAILY, Disp: 90 tablet, Rfl: 1   lisinopril (ZESTRIL) 5 MG tablet, Take 1 tablet (5 mg total) by mouth daily., Disp:  90 tablet, Rfl: 2   loratadine (ALLERGY) 10 MG tablet, Take 10 mg by mouth daily., Disp: , Rfl:    Multiple Vitamin (MULTIVITAMIN) LIQD, Take 5 mLs by mouth daily., Disp: , Rfl:    OVER THE COUNTER MEDICATION, Liquid vitamin c Advance immunity support, Disp: , Rfl:    OVER THE COUNTER MEDICATION, 5,000 mcg. Liquid vitamin b12, Disp: , Rfl:    OVER THE COUNTER MEDICATION, Vitamin d 3 liquid daily, Disp: , Rfl:    No Known Allergies   Review of Systems  Constitutional: Negative.   HENT: Negative.    Eyes: Negative.  Negative for blurred vision.  Respiratory: Negative.  Negative for shortness of breath.   Cardiovascular: Negative.  Negative for chest pain and palpitations.  Gastrointestinal: Negative.      Today's Vitals   02/22/22 1233  BP: 138/82  Pulse: (!) 57  Temp: 98.2 F (36.8 C)  TempSrc: Oral  Weight: 155 lb (70.3 kg)  Height: _0  (1.6 m)   Body mass index is 27.46 kg/m.  Wt Readings from Last 3 Encounters:  02/22/22 155 lb (70.3 kg)  08/09/21 162 lb 6.4 oz (73.7 kg)  10/12/20 157 lb 11.2 oz (71.5 kg)   BP Readings from Last 3 Encounters:  02/22/22  138/82  08/09/21 138/70  10/12/20 (!) 155/90    Objective:  Physical Exam Vitals and nursing note reviewed.  Constitutional:      Appearance: Normal appearance.  HENT:     Head: Normocephalic and atraumatic.     Nose:     Comments: Masked     Mouth/Throat:     Comments: Masked  Eyes:     Extraocular Movements: Extraocular movements intact.  Cardiovascular:     Rate and Rhythm: Normal rate and regular rhythm.     Heart sounds: Normal heart sounds.  Pulmonary:     Effort: Pulmonary effort is normal.     Breath sounds: Normal breath sounds.  Musculoskeletal:     Cervical back: Normal range of motion.  Skin:    General: Skin is warm.  Neurological:     General: No focal deficit present.     Mental Status: She is alert.  Psychiatric:        Mood and Affect: Mood normal.        Behavior: Behavior  normal.       Assessment And Plan:     1. Essential hypertension, benign Comments: Chronic, fair control. Goal BP<130/80. I would like to increase lisinopril to 58m; however, she is hesitant. - CMP14+EGFR - Lipid panel  2. Pure hypercholesterolemia Comments: Chronic, she does not wish to be on medication.  Currently, she is on atorvastatin. We discussed use of cardiac calcium score. - CMP14+EGFR - Lipid panel  3. Other abnormal glucose Comments: Her a1c has been elevated. I will recheck this today. She is encouraged to decrease her intake of sugary beverages, foods and processed meats . - Hemoglobin A1c  4. Acute pain of both shoulders Comments: She has pain sleeping on her sides, she thinks its due to weight loss.  Advised to apply topical pain cream to both shoulders. - Magnesium  5. Vitamin D deficiency Comments: I will check a vitamin D level and supplement as needed. - Vitamin D (25 hydroxy)  6. Need for influenza vaccination - Flu Vaccine QUAD 6+ mos PF IM (Fluarix Quad PF)   Patient was given opportunity to ask questions. Patient verbalized understanding of the plan and was able to repeat key elements of the plan. All questions were answered to their satisfaction.   I, RMaximino Greenland MD, have reviewed all documentation for this visit. The documentation on 02/22/22 for the exam, diagnosis, procedures, and orders are all accurate and complete.   IF YOU HAVE BEEN REFERRED TO A SPECIALIST, IT MAY TAKE 1-2 WEEKS TO SCHEDULE/PROCESS THE REFERRAL. IF YOU HAVE NOT HEARD FROM US/SPECIALIST IN TWO WEEKS, PLEASE GIVE [place] CALL AT [phone].   THE PATIENT IS ENCOURAGED TO PRACTICE SOCIAL DISTANCING DUE TO THE COVID-19 PANDEMIC.

## 2022-02-23 LAB — CMP14+EGFR
ALT: 85 IU/L — ABNORMAL HIGH (ref 0–32)
AST: 69 IU/L — ABNORMAL HIGH (ref 0–40)
Albumin/Globulin Ratio: 1.6 (ref 1.2–2.2)
Albumin: 5 g/dL — ABNORMAL HIGH (ref 3.9–4.9)
Alkaline Phosphatase: 127 IU/L — ABNORMAL HIGH (ref 44–121)
BUN/Creatinine Ratio: 11 — ABNORMAL LOW (ref 12–28)
BUN: 10 mg/dL (ref 8–27)
Bilirubin Total: 0.7 mg/dL (ref 0.0–1.2)
CO2: 24 mmol/L (ref 20–29)
Calcium: 10 mg/dL (ref 8.7–10.3)
Chloride: 101 mmol/L (ref 96–106)
Creatinine, Ser: 0.93 mg/dL (ref 0.57–1.00)
Globulin, Total: 3.1 g/dL (ref 1.5–4.5)
Glucose: 75 mg/dL (ref 70–99)
Potassium: 3.7 mmol/L (ref 3.5–5.2)
Sodium: 140 mmol/L (ref 134–144)
Total Protein: 8.1 g/dL (ref 6.0–8.5)
eGFR: 69 mL/min/{1.73_m2} (ref >59)

## 2022-02-23 LAB — LIPID PANEL
Chol/HDL Ratio: 2.9 ratio (ref 0.0–4.4)
Cholesterol, Total: 171 mg/dL (ref 100–199)
HDL: 60 mg/dL (ref >39)
LDL Chol Calc (NIH): 93 mg/dL (ref 0–99)
Triglycerides: 97 mg/dL (ref 0–149)
VLDL Cholesterol Cal: 18 mg/dL (ref 5–40)

## 2022-02-23 LAB — MAGNESIUM: Magnesium: 2.2 mg/dL (ref 1.6–2.3)

## 2022-02-23 LAB — HEMOGLOBIN A1C
Est. average glucose Bld gHb Est-mCnc: 114 mg/dL
Hgb A1c MFr Bld: 5.6 % (ref 4.8–5.6)

## 2022-02-23 LAB — VITAMIN D 25 HYDROXY (VIT D DEFICIENCY, FRACTURES): Vit D, 25-Hydroxy: 63.7 ng/mL (ref 30.0–100.0)

## 2022-02-24 ENCOUNTER — Encounter: Payer: Self-pay | Admitting: Internal Medicine

## 2022-03-01 LAB — GAMMA GT: GGT: 53 IU/L (ref 0–60)

## 2022-03-01 LAB — SPECIMEN STATUS REPORT

## 2022-03-03 ENCOUNTER — Other Ambulatory Visit: Payer: Self-pay | Admitting: Internal Medicine

## 2022-04-17 ENCOUNTER — Encounter: Payer: Self-pay | Admitting: Internal Medicine

## 2022-06-13 ENCOUNTER — Other Ambulatory Visit: Payer: Self-pay | Admitting: Internal Medicine

## 2022-06-13 DIAGNOSIS — I10 Essential (primary) hypertension: Secondary | ICD-10-CM

## 2022-06-15 ENCOUNTER — Other Ambulatory Visit: Payer: Self-pay

## 2022-06-15 MED ORDER — LISINOPRIL 10 MG PO TABS: ORAL_TABLET | ORAL | 2 refills | Status: DC

## 2022-06-16 ENCOUNTER — Other Ambulatory Visit: Payer: Self-pay

## 2022-06-16 ENCOUNTER — Ambulatory Visit: Payer: Self-pay

## 2022-06-16 VITALS — BP 128/84 | HR 57 | Temp 98.1°F | Ht 63.0 in | Wt 155.0 lb

## 2022-06-16 DIAGNOSIS — I10 Essential (primary) hypertension: Secondary | ICD-10-CM

## 2022-06-16 DIAGNOSIS — Z79899 Other long term (current) drug therapy: Secondary | ICD-10-CM

## 2022-06-19 ENCOUNTER — Other Ambulatory Visit: Payer: 59

## 2022-06-19 DIAGNOSIS — Z79899 Other long term (current) drug therapy: Secondary | ICD-10-CM

## 2022-06-19 NOTE — Progress Notes (Signed)
Patient presents today for bpc. She reports taking lisinopril 5mg  twice daily. Refill sent for 10mg  to take once daily. Denies headache, chest pain, SOB. Patient will follow up at May appointment.

## 2022-06-19 NOTE — Patient Instructions (Signed)
Hypertension, Adult ?Hypertension is another name for high blood pressure. High blood pressure forces your heart to work harder to pump blood. This can cause problems over time. ?There are two numbers in a blood pressure reading. There is a top number (systolic) over a bottom number (diastolic). It is best to have a blood pressure that is below 120/80. ?What are the causes? ?The cause of this condition is not known. Some other conditions can lead to high blood pressure. ?What increases the risk? ?Some lifestyle factors can make you more likely to develop high blood pressure: ?Smoking. ?Not getting enough exercise or physical activity. ?Being overweight. ?Having too much fat, sugar, calories, or salt (sodium) in your diet. ?Drinking too much alcohol. ?Other risk factors include: ?Having any of these conditions: ?Heart disease. ?Diabetes. ?High cholesterol. ?Kidney disease. ?Obstructive sleep apnea. ?Having a family history of high blood pressure and high cholesterol. ?Age. The risk increases with age. ?Stress. ?What are the signs or symptoms? ?High blood pressure may not cause symptoms. Very high blood pressure (hypertensive crisis) may cause: ?Headache. ?Fast or uneven heartbeats (palpitations). ?Shortness of breath. ?Nosebleed. ?Vomiting or feeling like you may vomit (nauseous). ?Changes in how you see. ?Very bad chest pain. ?Feeling dizzy. ?Seizures. ?How is this treated? ?This condition is treated by making healthy lifestyle changes, such as: ?Eating healthy foods. ?Exercising more. ?Drinking less alcohol. ?Your doctor may prescribe medicine if lifestyle changes do not help enough and if: ?Your top number is above 130. ?Your bottom number is above 80. ?Your personal target blood pressure may vary. ?Follow these instructions at home: ?Eating and drinking ? ?If told, follow the DASH eating plan. To follow this plan: ?Fill one half of your plate at each meal with fruits and vegetables. ?Fill one fourth of your plate  at each meal with whole grains. Whole grains include whole-wheat pasta, brown rice, and whole-grain bread. ?Eat or drink low-fat dairy products, such as skim milk or low-fat yogurt. ?Fill one fourth of your plate at each meal with low-fat (lean) proteins. Low-fat proteins include fish, chicken without skin, eggs, beans, and tofu. ?Avoid fatty meat, cured and processed meat, or chicken with skin. ?Avoid pre-made or processed food. ?Limit the amount of salt in your diet to less than 1,500 mg each day. ?Do not drink alcohol if: ?Your doctor tells you not to drink. ?You are pregnant, may be pregnant, or are planning to become pregnant. ?If you drink alcohol: ?Limit how much you have to: ?0-1 drink a day for women. ?0-2 drinks a day for men. ?Know how much alcohol is in your drink. In the [place] one drink equals one 12 oz bottle of beer (355 mL), one 5 oz glass of wine (148 mL), or one 1? oz glass of hard liquor (44 mL). ?Lifestyle ? ?Work with your doctor to stay at a healthy weight or to lose weight. Ask your doctor what the best weight is for you. ?Get at least 30 minutes of exercise that causes your heart to beat faster (aerobic exercise) most days of the week. This may include walking, swimming, or biking. ?Get at least 30 minutes of exercise that strengthens your muscles (resistance exercise) at least 3 days a week. This may include lifting weights or doing Pilates. ?Do not smoke or use any products that contain nicotine or tobacco. If you need help quitting, ask your doctor. ?Check your blood pressure at home as told by your doctor. ?Keep all follow-up visits. ?Medicines ?Take over-the-counter and prescription medicines   only as told by your doctor. Follow directions carefully. ?Do not skip doses of blood pressure medicine. The medicine does not work as well if you skip doses. Skipping doses also puts you at risk for problems. ?Ask your doctor about side effects or reactions to medicines that you should watch  for. ?Contact a doctor if: ?You think you are having a reaction to the medicine you are taking. ?You have headaches that keep coming back. ?You feel dizzy. ?You have swelling in your ankles. ?You have trouble with your vision. ?Get help right away if: ?You get a very bad headache. ?You start to feel mixed up (confused). ?You feel weak or numb. ?You feel faint. ?You have very bad pain in your: ?Chest. ?Belly (abdomen). ?You vomit more than once. ?You have trouble breathing. ?These symptoms may be an emergency. Get help right away. Call 911. ?Do not wait to see if the symptoms will go away. ?Do not drive yourself to the hospital. ?Summary ?Hypertension is another name for high blood pressure. ?High blood pressure forces your heart to work harder to pump blood. ?For most people, a normal blood pressure is less than 120/80. ?Making healthy choices can help lower blood pressure. If your blood pressure does not get lower with healthy choices, you may need to take medicine. ?This information is not intended to replace advice given to you by your health care provider. Make sure you discuss any questions you have with your health care provider. ?Document Revised: 01/06/2021 Document Reviewed: 01/06/2021 ?Elsevier Patient Education ? 2023 Elsevier Inc. ? ?

## 2022-06-20 LAB — BMP8+EGFR
BUN/Creatinine Ratio: 14 (ref 12–28)
BUN: 13 mg/dL (ref 8–27)
CO2: 25 mmol/L (ref 20–29)
Calcium: 10.1 mg/dL (ref 8.7–10.3)
Chloride: 105 mmol/L (ref 96–106)
Creatinine, Ser: 0.9 mg/dL (ref 0.57–1.00)
Glucose: 83 mg/dL (ref 70–99)
Potassium: 3.7 mmol/L (ref 3.5–5.2)
Sodium: 143 mmol/L (ref 134–144)
eGFR: 72 mL/min/{1.73_m2} (ref >59)

## 2022-07-21 ENCOUNTER — Ambulatory Visit
Admission: EM | Admit: 2022-07-21 | Discharge: 2022-07-21 | Disposition: A | Discharge status: Other Acute Inpatient Hospital | Payer: 59

## 2022-07-21 ENCOUNTER — Other Ambulatory Visit: Payer: Self-pay

## 2022-07-21 ENCOUNTER — Emergency Department (HOSPITAL_BASED_OUTPATIENT_CLINIC_OR_DEPARTMENT_OTHER)
Admission: EM | Admit: 2022-07-21 | Discharge: 2022-07-21 | Disposition: A | Discharge status: Home / Self Care | Payer: 59 | Attending: Emergency Medicine | Admitting: Emergency Medicine

## 2022-07-21 ENCOUNTER — Encounter (HOSPITAL_BASED_OUTPATIENT_CLINIC_OR_DEPARTMENT_OTHER): Payer: Self-pay

## 2022-07-21 DIAGNOSIS — R197 Diarrhea, unspecified: Secondary | ICD-10-CM

## 2022-07-21 LAB — URINALYSIS, ROUTINE W REFLEX MICROSCOPIC
Bilirubin Urine: NEGATIVE
Glucose, UA: NEGATIVE mg/dL
Ketones, ur: NEGATIVE mg/dL
Nitrite: NEGATIVE
Protein, ur: NEGATIVE mg/dL
Specific Gravity, Urine: 1.006 (ref 1.005–1.030)
pH: 5 (ref 5.0–8.0)

## 2022-07-21 LAB — COMPREHENSIVE METABOLIC PANEL
ALT: 16 U/L (ref 0–44)
AST: 26 U/L (ref 15–41)
Albumin: 4.4 g/dL (ref 3.5–5.0)
Alkaline Phosphatase: 55 U/L (ref 38–126)
Anion gap: 8 (ref 5–15)
BUN: 11 mg/dL (ref 8–23)
CO2: 25 mmol/L (ref 22–32)
Calcium: 10.3 mg/dL (ref 8.9–10.3)
Chloride: 105 mmol/L (ref 98–111)
Creatinine, Ser: 1 mg/dL (ref 0.44–1.00)
GFR, Estimated: >60 mL/min (ref >60)
Glucose, Bld: 95 mg/dL (ref 70–99)
Potassium: 3.7 mmol/L (ref 3.5–5.1)
Sodium: 138 mmol/L (ref 135–145)
Total Bilirubin: 0.8 mg/dL (ref 0.3–1.2)
Total Protein: 7.8 g/dL (ref 6.5–8.1)

## 2022-07-21 LAB — CBC WITH DIFFERENTIAL/PLATELET
Abs Immature Granulocytes: 0 10*3/uL (ref 0.00–0.07)
Basophils Absolute: 0 10*3/uL (ref 0.0–0.1)
Basophils Relative: 0 %
Eosinophils Absolute: 0.1 10*3/uL (ref 0.0–0.5)
Eosinophils Relative: 2 %
HCT: 39.8 % (ref 36.0–46.0)
Hemoglobin: 12.8 g/dL (ref 12.0–15.0)
Immature Granulocytes: 0 %
Lymphocytes Relative: 44 %
Lymphs Abs: 1.6 10*3/uL (ref 0.7–4.0)
MCH: 26.2 pg (ref 26.0–34.0)
MCHC: 32.2 g/dL (ref 30.0–36.0)
MCV: 81.6 fL (ref 80.0–100.0)
Monocytes Absolute: 0.3 10*3/uL (ref 0.1–1.0)
Monocytes Relative: 9 %
Neutro Abs: 1.7 10*3/uL (ref 1.7–7.7)
Neutrophils Relative %: 45 %
Platelets: 184 10*3/uL (ref 150–400)
RBC: 4.88 MIL/uL (ref 3.87–5.11)
RDW: 13.5 % (ref 11.5–15.5)
WBC: 3.9 10*3/uL — ABNORMAL LOW (ref 4.0–10.5)
nRBC: 0 % (ref 0.0–0.2)

## 2022-07-21 LAB — MAGNESIUM: Magnesium: 1.9 mg/dL (ref 1.7–2.4)

## 2022-07-21 MED ORDER — SODIUM CHLORIDE 0.9 % IV BOLUS
1000.0000 mL | Freq: Once | INTRAVENOUS | Status: AC
  Administered 2022-07-21: 1000 mL via INTRAVENOUS

## 2022-07-21 MED ORDER — LOPERAMIDE HCL 2 MG PO CAPS
4.0000 mg | ORAL_CAPSULE | Freq: Once | ORAL | Status: AC
  Administered 2022-07-21: 4 mg via ORAL
  Filled 2022-07-21: qty 2

## 2022-07-21 NOTE — ED Provider Notes (Signed)
[facility]    CSN: [id number] Arrival date & time: 07/21/22  1635      History   Chief Complaint Chief Complaint  Patient presents with   Diarrhea   GI Problem    HPI Nygeria Sudie Bandel is a 62 y.o. female.   Patient presents with 5-day history of diarrhea.  Patient also reports feelings of bloating and increased flatulence.  Denies nausea and vomiting.  Denies blood in stool.  Patient reports that she was having normal bowel movements but noticed an abnormal smell to her flatulence.  She states that when she notices this, she does a detox.  Therefore, she took a laxative and drink a detox tea which caused the diarrhea to start.  Also reports that she has lost 5 pounds since diarrhea started.  She has been having difficulty tolerating foods but has been able to drink water.   Diarrhea GI Problem    Past Medical History:  Diagnosis Date   COVID 05/2020   mild symptoms x few days all symptoms resolved   Essential hypertension, benign    Hematuria 08/27/2020   pt in er, issue resolved as of 10-07-2020   Hot flashes 10/07/2020   Hyperlipidemia    Pneumonia    yrs ago per pt on 10-07-2020   Right renal stone 10/07/2020   Wears dentures 10/07/2020   upper   Wears glasses 10/07/2020    Patient Active Problem List   Diagnosis Date Noted   Essential hypertension, benign 07/22/2018    Past Surgical History:  Procedure Laterality Date   CYSTOSCOPY WITH RETROGRADE PYELOGRAM, URETEROSCOPY AND STENT PLACEMENT Right 10/12/2020   Procedure: CYSTOSCOPY WITH RETROGRADE PYELOGRAM, URETEROSCOPY AND STENT PLACEMENT AND LASER LITHOTRIPSY;  Surgeon: Noel Christmas, MD;  Location: [facility];  Service: Urology;  Laterality: Right;  1 HR   TONSILLECTOMY  1973    OB History   No obstetric history on file.      Home Medications    Prior to Admission medications   Medication Sig Start Date End Date Taking? Authorizing Provider  atorvastatin (LIPITOR)  80 MG tablet TAKE 1 TABLET(80 MG) BY MOUTH DAILY 03/03/22  Yes Dorothyann Peng, MD  lisinopril (ZESTRIL) 10 MG tablet TAKE 1 TABLET DAILY. 06/15/22  Yes Dorothyann Peng, MD  loratadine (ALLERGY) 10 MG tablet Take 10 mg by mouth daily.   Yes [provider]  Multiple Vitamin (MULTIVITAMIN) LIQD Take 5 mLs by mouth daily.   Yes [provider]  OVER THE COUNTER MEDICATION Liquid vitamin c Advance immunity support    [provider]  OVER THE COUNTER MEDICATION 5,000 mcg. Liquid vitamin b12    [provider]  OVER THE COUNTER MEDICATION Vitamin d 3 liquid daily    [provider]    Family History Family History  Problem Relation Age of Onset   Fibroids Mother    Cancer Mother        unknown   Aneurysm Father    Cancer Other        maternal   Breast cancer Other    Breast cancer Cousin        maternal side    Social History Social History   Tobacco Use   Smoking status: Never   Smokeless tobacco: Never  Vaping Use   Vaping Use: Never used  Substance Use Topics   Alcohol use: Never   Drug use: Never     Allergies   Patient has no known  allergies.   Review of Systems Review of Systems Per HPI  Physical Exam Triage Vital Signs ED Triage Vitals [07/21/22 1643]  Enc Vitals Group     BP 131/84     Pulse Rate 73     Resp 18     Temp 98.4 F (36.9 C)     Temp Source Oral     SpO2 97 %     Weight      Height      Head Circumference      Peak Flow      Pain Score      Pain Loc      Pain Edu?      Excl. in GC?    No data found.  Updated Vital Signs BP 131/84 (BP Location: Left Arm)   Pulse 73   Temp 98.4 F (36.9 C) (Oral)   Resp 18   SpO2 97%   Visual Acuity Right Eye Distance:   Left Eye Distance:   Bilateral Distance:    Right Eye Near:   Left Eye Near:    Bilateral Near:     Physical Exam Constitutional:      General: She is not in acute distress.    Appearance: Normal appearance. She is not  toxic-appearing or diaphoretic.  HENT:     Head: Normocephalic and atraumatic.     Mouth/Throat:     Mouth: Mucous membranes are dry.     Pharynx: No posterior oropharyngeal erythema.  Eyes:     Extraocular Movements: Extraocular movements intact.     Conjunctiva/sclera: Conjunctivae normal.  Cardiovascular:     Rate and Rhythm: Normal rate and regular rhythm.     Pulses: Normal pulses.     Heart sounds: Normal heart sounds.  Pulmonary:     Effort: Pulmonary effort is normal. No respiratory distress.     Breath sounds: Normal breath sounds.  Abdominal:     General: Bowel sounds are normal. There is no distension.     Palpations: Abdomen is soft.     Tenderness: There is no abdominal tenderness.  Neurological:     General: No focal deficit present.     Mental Status: She is alert and oriented to person, place, and time. Mental status is at baseline.  Psychiatric:        Mood and Affect: Mood normal.        Behavior: Behavior normal.        Thought Content: Thought content normal.        Judgment: Judgment normal.      UC Treatments / Results  Labs (all labs ordered are listed, but only abnormal results are displayed) Labs Reviewed - No data to display  EKG   Radiology No results found.  Procedures Procedures (including critical care time)  Medications Ordered in UC Medications - No data to display  Initial Impression / Assessment and Plan / UC Course  I have reviewed the triage vital signs and the nursing notes.  Pertinent labs & imaging results that were available during my care of the patient were reviewed by me and considered in my medical decision making (see chart for details).     Patient's diarrhea is most likely due to laxative and detox tea that she drink prior to symptoms starting.  Although, I am concerned for dehydration given dry mucous membranes and patient's weight loss.  Therefore, I do think that IV fluid hydration and stat blood work is  reasonable so the patient  was advised to go to the ER for further evaluation and management of this.  She was agreeable with plan.  Vital signs stable at discharge.  Agree with patient self transport to the ER. Final Clinical Impressions(s) / UC Diagnoses   Final diagnoses:  Diarrhea, unspecified type     Discharge Instructions      Please go to the emergency department as soon as you leave urgent care for further evaluation and management as I am concerned for dehydration and need for IV fluids.    ED Prescriptions   None    PDMP not reviewed this encounter.   Gustavus Bryant, [place] 07/21/22 [phone]

## 2022-07-21 NOTE — ED Notes (Signed)
Pt verbalized understanding of d/c instructions, meds, and followup care. Denies questions. VSS, no distress noted. Steady gait to exit with all belongings.  ?

## 2022-07-21 NOTE — Discharge Instructions (Signed)
Imodium as needed for diarrhea.  See your Physicain for recheck.  Return if any problems.

## 2022-07-21 NOTE — ED Notes (Signed)
Patient is being discharged from the Urgent Care and sent to the Emergency Department via pov . Per mound np, patient is in need of higher level of care due to dehydration and dry mucous membranes . Patient is aware and verbalizes understanding of plan of care.  Vitals:   07/21/22 1643  BP: 131/84  Pulse: 73  Resp: 18  Temp: 98.4 F (36.9 C)  SpO2: 97%

## 2022-07-21 NOTE — ED Provider Notes (Signed)
[facility] Provider Note   CSN: [id number] Arrival date & time: 07/21/22  1723     History  Chief Complaint  Patient presents with   Diarrhea    Victoria Strickland is a 62 y.o. female.  Patient presents to the emergency department for evaluation of diarrhea.  She states that about a week ago she was developing increased flatulence and decided to take a "laxative tea" 6 days ago.  She developed profuse diarrhea 4 days ago.  Stool is watery and brown.  No bleeding.  No associated nausea or vomiting.  She has had increased abdominal bloating without focal abdominal pain.  She denies recent travel, suspicious food or water exposures.  She has not taken any medications for diarrhea.  No fever.  Seen at urgent care, sent for consideration of dehydration.         Home Medications Prior to Admission medications   Medication Sig Start Date End Date Taking? Authorizing Provider  atorvastatin (LIPITOR) 80 MG tablet TAKE 1 TABLET(80 MG) BY MOUTH DAILY 03/03/22   Dorothyann Peng, MD  lisinopril (ZESTRIL) 10 MG tablet TAKE 1 TABLET DAILY. 06/15/22   Dorothyann Peng, MD  loratadine (ALLERGY) 10 MG tablet Take 10 mg by mouth daily.    [provider]  Multiple Vitamin (MULTIVITAMIN) LIQD Take 5 mLs by mouth daily.    [provider]  OVER THE COUNTER MEDICATION Liquid vitamin c Advance immunity support    [provider]  OVER THE COUNTER MEDICATION 5,000 mcg. Liquid vitamin b12    [provider]  OVER THE COUNTER MEDICATION Vitamin d 3 liquid daily    [provider]      Allergies    Patient has no known allergies.    Review of Systems   Review of Systems  Physical Exam Updated Vital Signs BP (!) 148/91 (BP Location: Right Arm)   Pulse 72   Temp 98.8 F (37.1 C)   Resp 16   SpO2 99%   Physical Exam Vitals and nursing note reviewed.  Constitutional:      General: She is not in acute  distress.    Appearance: She is well-developed.  HENT:     Head: Normocephalic and atraumatic.     Right Ear: External ear normal.     Left Ear: External ear normal.     Nose: Nose normal.     Mouth/Throat:     Mouth: Mucous membranes are dry.  Eyes:     Conjunctiva/sclera: Conjunctivae normal.  Cardiovascular:     Rate and Rhythm: Normal rate and regular rhythm.     Heart sounds: No murmur heard. Pulmonary:     Effort: No respiratory distress.     Breath sounds: No wheezing, rhonchi or rales.  Abdominal:     Palpations: Abdomen is soft.     Tenderness: There is no abdominal tenderness. There is no guarding or rebound.  Musculoskeletal:     Cervical back: Normal range of motion and neck supple.     Right lower leg: No edema.     Left lower leg: No edema.  Skin:    General: Skin is warm and dry.     Findings: No rash.  Neurological:     General: No focal deficit present.     Mental Status: She is alert. Mental status is at baseline.     Motor: No weakness.  Psychiatric:        Mood and Affect:  Mood normal.     ED Results / Procedures / Treatments   Labs (all labs ordered are listed, but only abnormal results are displayed) Labs Reviewed  CBC WITH DIFFERENTIAL/PLATELET  COMPREHENSIVE METABOLIC PANEL  MAGNESIUM  URINALYSIS, ROUTINE W REFLEX MICROSCOPIC    EKG None  Radiology No results found.  Procedures Procedures    Medications Ordered in ED Medications  loperamide (IMODIUM) capsule 4 mg (has no administration in time range)  sodium chloride 0.9 % bolus 1,000 mL (has no administration in time range)    ED Course/ Medical Decision Making/ A&P    Patient seen and examined. History obtained directly from patient.  Reviewed urgent care notes.  Labs/EKG: Ordered CBC, CMP, magnesium, UA.  Imaging: None ordered, consider CT imaging with patient has a reassuring abdominal exam  Medications/Fluids: Ordered: Imodium p.o., IV fluid bolus  Most recent vital  signs reviewed and are as follows: BP (!) 148/91 (BP Location: Right Arm)   Pulse 72   Temp 98.8 F (37.1 C)   Resp 16   SpO2 99%   Initial impression: Diarrheal illness, evaluation for complications dehydration.  6:53 PM Signout to [place] PA-C at shift change.   Plan: Follow-up lab workup, reassess after IV fluids                            Medical Decision Making Amount and/or Complexity of Data Reviewed Labs: ordered.  Risk Prescription drug management.   Well-appearing patient with recent diarrhea after taking a laxative.  Abdominal exam is overall reassuring without focal tenderness. No other red flags on initial exam. She denies fevers.  No bloody stool.  Awaiting remainder of workup at this time.        Final Clinical Impression(s) / ED Diagnoses Final diagnoses:  Diarrhea, unspecified type    Rx / DC Orders ED Discharge Orders     None         Renne Crigler, PA-C 07/21/22 Ma Hillock, MD 07/24/22 [phone]

## 2022-07-21 NOTE — ED Triage Notes (Signed)
Pt c/o diarrhea, bloating & gas x4 days. States that last Friday, she "needed a cleanse" so she took mag citrate, Saturday, she took detox tea, diarrhea began Monday after work. States she did not try imodium, "took a little bit of mylanta but not even a spoonful." States she is sick of the diarrhea & bloating.

## 2022-07-21 NOTE — ED Provider Notes (Signed)
Patient's care assumed by me at 7 PM from [facility].  Patient has pending lab work.  Patient is here for evaluation of diarrhea. Patient's laboratory evaluations returned patient's white blood cell count is 3.9 chemistry is normal magnesium is 1.9.  UA shows rare bacteria otherwise negative.  Patient given results of her testing patient's vital signs are stable abdomen is soft and nontender.  I advised the patient that she could take a dose of Imodium if needed she is advised to follow-up with her physician she read to return to the emergency department if symptoms worsen or change   Victoria Strickland 07/21/22 2135    Glyn Ade, MD 07/24/22 0800

## 2022-07-21 NOTE — Discharge Instructions (Signed)
Please go to the emergency department as soon as you leave urgent care for further evaluation and management as I am concerned for dehydration and need for IV fluids.

## 2022-07-21 NOTE — ED Triage Notes (Signed)
3-day history of diarrhea stools. Pt states she took an laxative over the weekend and then frank detox tea.

## 2022-08-07 ENCOUNTER — Other Ambulatory Visit: Payer: Self-pay | Admitting: Internal Medicine

## 2022-08-07 DIAGNOSIS — Z1231 Encounter for screening mammogram for malignant neoplasm of breast: Secondary | ICD-10-CM

## 2022-08-22 ENCOUNTER — Encounter: Payer: Self-pay | Admitting: Internal Medicine

## 2022-08-22 ENCOUNTER — Ambulatory Visit: Payer: 59 | Admitting: Internal Medicine

## 2022-08-22 VITALS — BP 120/70 | HR 63 | Temp 98.3°F | Ht 63.0 in | Wt 159.8 lb

## 2022-08-22 DIAGNOSIS — Z0001 Encounter for general adult medical examination with abnormal findings: Secondary | ICD-10-CM

## 2022-08-22 DIAGNOSIS — Z6828 Body mass index (BMI) 28.0-28.9, adult: Secondary | ICD-10-CM | POA: Diagnosis not present

## 2022-08-22 DIAGNOSIS — I10 Essential (primary) hypertension: Secondary | ICD-10-CM

## 2022-08-22 DIAGNOSIS — E7800 Pure hypercholesterolemia, unspecified: Secondary | ICD-10-CM

## 2022-08-22 DIAGNOSIS — Z2821 Immunization not carried out because of patient refusal: Secondary | ICD-10-CM

## 2022-08-22 DIAGNOSIS — Z0000 Encounter for general adult medical examination without abnormal findings: Secondary | ICD-10-CM

## 2022-08-22 LAB — POCT URINALYSIS DIPSTICK
Bilirubin, UA: NEGATIVE
Glucose, UA: NEGATIVE
Ketones, UA: NEGATIVE
Leukocytes, UA: NEGATIVE
Nitrite, UA: NEGATIVE
Protein, UA: NEGATIVE
Spec Grav, UA: 1.015 (ref 1.010–1.025)
Urobilinogen, UA: 0.2 E.U./dL — AB
pH, UA: 6 (ref 5.0–8.0)

## 2022-08-22 NOTE — Progress Notes (Signed)
Jeri Cos [place]leton,acting as a Neurosurgeon for Gwynneth Aliment, MD.,have documented all relevant documentation on the behalf of Gwynneth Aliment, MD,as directed by  Gwynneth Aliment, MD while in the presence of Gwynneth Aliment, MD.   Subjective:     Patient ID: Victoria Strickland , female    DOB: [birth year] , 62 y.o.   MRN: [id number]   Chief Complaint  Patient presents with   Annual Exam   Hypertension    HPI  She is here today for a full physical exam. She is followed by Dr. Senaida Ores, GYN for her pelvic exams.  She was last seen in 2023. She reports compliance with BP meds, now on lisinopril 10mg  1/2 tab daily. She has also stopped the atorvastatin - she prefers to work on lifestyle changes.   Hypertension This is a chronic problem. The current episode started more than 1 year ago. The problem has been gradually improving since onset. The problem is controlled. Pertinent negatives include no blurred vision. Risk factors for coronary artery disease include post-menopausal state. The current treatment provides moderate improvement. There is no history of kidney disease or CAD/MI.     Past Medical History:  Diagnosis Date   COVID 05/2020   mild symptoms x few days all symptoms resolved   Essential hypertension, benign    Hematuria 08/27/2020   pt in er, issue resolved as of 10-07-2020   Hot flashes 10/07/2020   Hyperlipidemia    Pneumonia    yrs ago per pt on 10-07-2020   Right renal stone 10/07/2020   Wears dentures 10/07/2020   upper   Wears glasses 10/07/2020     Family History  Problem Relation Age of Onset   Fibroids Mother    Cancer Mother        unknown   Aneurysm Father    Cancer Other        maternal   Breast cancer Other    Breast cancer Cousin        maternal side     Current Outpatient Medications:    lisinopril (ZESTRIL) 10 MG tablet, TAKE 1 TABLET DAILY., Disp: 90 tablet, Rfl: 2   loratadine (ALLERGY) 10 MG tablet, Take 10 mg by mouth daily., Disp: , Rfl:     Multiple Vitamin (MULTIVITAMIN) LIQD, Take 5 mLs by mouth daily., Disp: , Rfl:    OVER THE COUNTER MEDICATION, Liquid vitamin c Advance immunity support, Disp: , Rfl:    OVER THE COUNTER MEDICATION, 5,000 mcg. Liquid vitamin b12, Disp: , Rfl:    OVER THE COUNTER MEDICATION, Vitamin d 3 liquid daily, Disp: , Rfl:    No Known Allergies    The patient states she uses post menopausal status for birth control. Last LMP was No LMP recorded. Patient is postmenopausal.. Negative for Dysmenorrhea. Negative for: breast discharge, breast lump(s), breast pain and breast self exam. Associated symptoms include abnormal vaginal bleeding. Pertinent negatives include abnormal bleeding (hematology), anxiety, decreased libido, depression, difficulty falling sleep, dyspareunia, history of infertility, nocturia, sexual dysfunction, sleep disturbances, urinary incontinence, urinary urgency, vaginal discharge and vaginal itching. Diet regular.The patient states her exercise level is  moderate, she stretches, walks and uses her elliptical.   . The patient's tobacco use is:  Social History   Tobacco Use  Smoking Status Never  Smokeless Tobacco Never  . She has been exposed to passive smoke. The patient's alcohol use is:  Social History   Substance and Sexual Activity  Alcohol Use Never  Review of Systems  Constitutional: Negative.   HENT: Negative.    Eyes: Negative.  Negative for blurred vision.  Respiratory: Negative.    Cardiovascular: Negative.   Gastrointestinal: Negative.   Endocrine: Negative.   Genitourinary: Negative.   Musculoskeletal: Negative.   Skin: Negative.   Allergic/Immunologic: Negative.   Neurological: Negative.   Hematological: Negative.   Psychiatric/Behavioral: Negative.       Today's Vitals   08/22/22 0840  BP: 120/70  Pulse: 63  Temp: 98.3 F (36.8 C)  Weight: 159 lb 12.8 oz (72.5 kg)  Height: 5\' 3"  (1.6 m)  PainSc: 0-No pain   Body mass index is 28.31 kg/m.   Wt Readings from Last 3 Encounters:  08/22/22 159 lb 12.8 oz (72.5 kg)  06/19/22 155 lb (70.3 kg)  02/22/22 155 lb (70.3 kg)    Objective:  Physical Exam Vitals and nursing note reviewed.  Constitutional:      Appearance: Normal appearance.  HENT:     Head: Normocephalic and atraumatic.     Right Ear: Tympanic membrane, ear canal and external ear normal.     Left Ear: Tympanic membrane, ear canal and external ear normal.     Nose:     Comments: Masked     Mouth/Throat:     Comments: Masked  Eyes:     Extraocular Movements: Extraocular movements intact.     Conjunctiva/sclera: Conjunctivae normal.     Pupils: Pupils are equal, round, and reactive to light.  Cardiovascular:     Rate and Rhythm: Normal rate and regular rhythm.     Pulses: Normal pulses.     Heart sounds: Normal heart sounds.  Pulmonary:     Effort: Pulmonary effort is normal.     Breath sounds: Normal breath sounds.  Chest:  Breasts:    Tanner Score is 5.     Right: Normal.     Left: Normal.  Abdominal:     General: Abdomen is flat. Bowel sounds are normal.     Palpations: Abdomen is soft.  Genitourinary:    Comments: deferred Musculoskeletal:        General: Normal range of motion.     Cervical back: Normal range of motion and neck supple.  Skin:    General: Skin is warm and dry.  Neurological:     General: No focal deficit present.     Mental Status: She is alert and oriented to person, place, and time.  Psychiatric:        Mood and Affect: Mood normal.        Behavior: Behavior normal.         Assessment And Plan:     1. Routine general medical examination at health care facility Comments: A full exam was performed. Importance of monthly self breast exams was discussed with the patient.  PATIENT IS ADVISED TO GET 30-45 MINUTES REGULAR EXERCISE NO LESS THAN FOUR TO FIVE DAYS PER WEEK - BOTH WEIGHTBEARING EXERCISES AND AEROBIC ARE RECOMMENDED.  PATIENT IS ADVISED TO FOLLOW A HEALTHY DIET WITH  AT LEAST SIX FRUITS/VEGGIES PER DAY, DECREASE INTAKE OF RED MEAT, AND TO INCREASE FISH INTAKE TO TWO DAYS PER WEEK.  MEATS/FISH SHOULD NOT BE FRIED, BAKED OR BROILED IS PREFERABLE.  IT IS ALSO IMPORTANT TO CUT BACK ON YOUR SUGAR INTAKE. PLEASE AVOID ANYTHING WITH ADDED SUGAR, CORN SYRUP OR OTHER SWEETENERS. IF YOU MUST USE A SWEETENER, YOU CAN TRY STEVIA. IT IS ALSO IMPORTANT TO AVOID ARTIFICIALLY SWEETENERS AND DIET BEVERAGES.  LASTLY, I SUGGEST WEARING SPF 50 SUNSCREEN ON EXPOSED PARTS AND ESPECIALLY WHEN IN THE DIRECT SUNLIGHT FOR AN EXTENDED PERIOD OF TIME.  PLEASE AVOID FAST FOOD RESTAURANTS AND INCREASE YOUR WATER INTAKE. - CBC with Differential/Platelet - Lipid panel - TSH  2. Essential hypertension, benign Comments: Chronic, well controlled. EKG performed, NSR w/o acute changes. She admits that she is now taking 1/2 tablet lisinopril. Encouraged to follow low sodium diet. F/u in six months. - POCT Urinalysis Dipstick (81002) - Microalbumin / Creatinine Urine Ratio - EKG 12-Lead  3. Pure hypercholesterolemia Comments: Chronic, since her last visit, she has stopped atorvastatin. She prefers to work on lifestyle changes. I will check lipid panel today.  We also discussed use of cardiac calcium scoring for risk stratification. She agrees to move forward with testing.  - CT CARDIAC SCORING (SELF PAY ONLY); Future  4. BMI 28.0-28.9,adult Comments: She is encouraged to aim for at least 150 minutes of exercise/week.  5. Herpes zoster vaccination declined    Return for 1 year physical, 6 month bp. Patient was given opportunity to ask questions. Patient verbalized understanding of the plan and was able to repeat key elements of the plan. All questions were answered to their satisfaction.   I, Gwynneth Aliment, MD, have reviewed all documentation for this visit. The documentation on 08/22/22 for the exam, diagnosis, procedures, and orders are all accurate and complete.   THE PATIENT IS  ENCOURAGED TO PRACTICE SOCIAL DISTANCING DUE TO THE COVID-19 PANDEMIC.

## 2022-08-22 NOTE — Patient Instructions (Addendum)
[facility]   Health Maintenance, Female Adopting a healthy lifestyle and getting preventive care are important in promoting health and wellness. Ask your health care provider about: The right schedule for you to have regular tests and exams. Things you can do on your own to prevent diseases and keep yourself healthy. What should I know about diet, weight, and exercise? Eat a healthy diet  Eat a diet that includes plenty of vegetables, fruits, low-fat dairy products, and lean protein. Do not eat a lot of foods that are high in solid fats, added sugars, or sodium. Maintain a healthy weight Body mass index (BMI) is used to identify weight problems. It estimates body fat based on height and weight. Your health care provider can help determine your BMI and help you achieve or maintain a healthy weight. Get regular exercise Get regular exercise. This is one of the most important things you can do for your health. Most adults should: Exercise for at least 150 minutes each week. The exercise should increase your heart rate and make you sweat (moderate-intensity exercise). Do strengthening exercises at least twice a week. This is in addition to the moderate-intensity exercise. Spend less time sitting. Even light physical activity can be beneficial. Watch cholesterol and blood lipids Have your blood tested for lipids and cholesterol at 63 years of age, then have this test every 5 years. Have your cholesterol levels checked more often if: Your lipid or cholesterol levels are high. You are older than 62 years of age. You are at high risk for heart disease. What should I know about cancer screening? Depending on your health history and family history, you may need to have cancer screening at various ages. This may include screening for: Breast cancer. Cervical cancer. Colorectal cancer. Skin cancer. Lung cancer. What should I know about heart disease, diabetes, and high blood pressure? Blood pressure  and heart disease High blood pressure causes heart disease and increases the risk of stroke. This is more likely to develop in people who have high blood pressure readings or are overweight. Have your blood pressure checked: Every 3-5 years if you are 31-11 years of age. Every year if you are 51 years old or older. Diabetes Have regular diabetes screenings. This checks your fasting blood sugar level. Have the screening done: Once every three years after age 81 if you are at a normal weight and have a low risk for diabetes. More often and at a younger age if you are overweight or have a high risk for diabetes. What should I know about preventing infection? Hepatitis B If you have a higher risk for hepatitis B, you should be screened for this virus. Talk with your health care provider to find out if you are at risk for hepatitis B infection. Hepatitis C Testing is recommended for: Everyone born from [birth year] through 1966. Anyone with known risk factors for hepatitis C. Sexually transmitted infections (STIs) Get screened for STIs, including gonorrhea and chlamydia, if: You are sexually active and are younger than 63 years of age. You are older than 63 years of age and your health care provider tells you that you are at risk for this type of infection. Your sexual activity has changed since you were last screened, and you are at increased risk for chlamydia or gonorrhea. Ask your health care provider if you are at risk. Ask your health care provider about whether you are at high risk for HIV. Your health care provider may recommend a prescription medicine to  help prevent HIV infection. If you choose to take medicine to prevent HIV, you should first get tested for HIV. You should then be tested every 3 months for as long as you are taking the medicine. Pregnancy If you are about to stop having your period (premenopausal) and you may become pregnant, seek counseling before you get pregnant. Take 400 to  800 micrograms (mcg) of folic acid every day if you become pregnant. Ask for birth control (contraception) if you want to prevent pregnancy. Osteoporosis and menopause Osteoporosis is a disease in which the bones lose minerals and strength with aging. This can result in bone fractures. If you are 28 years old or older, or if you are at risk for osteoporosis and fractures, ask your health care provider if you should: Be screened for bone loss. Take a calcium or vitamin D supplement to lower your risk of fractures. Be given hormone replacement therapy (HRT) to treat symptoms of menopause. Follow these instructions at home: Alcohol use Do not drink alcohol if: Your health care provider tells you not to drink. You are pregnant, may be pregnant, or are planning to become pregnant. If you drink alcohol: Limit how much you have to: 0-1 drink a day. Know how much alcohol is in your drink. In the [place] one drink equals one 12 oz bottle of beer (355 mL), one 5 oz glass of wine (148 mL), or one 1 oz glass of hard liquor (44 mL). Lifestyle Do not use any products that contain nicotine or tobacco. These products include cigarettes, chewing tobacco, and vaping devices, such as e-cigarettes. If you need help quitting, ask your health care provider. Do not use street drugs. Do not share needles. Ask your health care provider for help if you need support or information about quitting drugs. General instructions Schedule regular health, dental, and eye exams. Stay current with your vaccines. Tell your health care provider if: You often feel depressed. You have ever been abused or do not feel safe at home. Summary Adopting a healthy lifestyle and getting preventive care are important in promoting health and wellness. Follow your health care provider's instructions about healthy diet, exercising, and getting tested or screened for diseases. Follow your health care provider's instructions on monitoring  your cholesterol and blood pressure. This information is not intended to replace advice given to you by your health care provider. Make sure you discuss any questions you have with your health care provider. Document Revised: 08/09/2020 Document Reviewed: 08/09/2020 Elsevier Patient Education  2023 [employer].

## 2022-08-23 LAB — CBC WITH DIFFERENTIAL/PLATELET
Basophils Absolute: 0 10*3/uL (ref 0.0–0.2)
Basos: 1 %
EOS (ABSOLUTE): 0.1 10*3/uL (ref 0.0–0.4)
Eos: 3 %
Hematocrit: 38.8 % (ref 34.0–46.6)
Hemoglobin: 12.3 g/dL (ref 11.1–15.9)
Immature Grans (Abs): 0 10*3/uL (ref 0.0–0.1)
Immature Granulocytes: 0 %
Lymphocytes Absolute: 1.8 10*3/uL (ref 0.7–3.1)
Lymphs: 57 %
MCH: 26.2 pg — ABNORMAL LOW (ref 26.6–33.0)
MCHC: 31.7 g/dL (ref 31.5–35.7)
MCV: 83 fL (ref 79–97)
Monocytes Absolute: 0.2 10*3/uL (ref 0.1–0.9)
Monocytes: 5 %
Neutrophils Absolute: 1.1 10*3/uL — ABNORMAL LOW (ref 1.4–7.0)
Neutrophils: 34 %
Platelets: 189 10*3/uL (ref 150–450)
RBC: 4.7 x10E6/uL (ref 3.77–5.28)
RDW: 13.1 % (ref 11.7–15.4)
WBC: 3.2 10*3/uL — ABNORMAL LOW (ref 3.4–10.8)

## 2022-08-23 LAB — MICROALBUMIN / CREATININE URINE RATIO
Creatinine, Urine: 37.7 mg/dL
Microalb/Creat Ratio: 13 mg/g creat (ref 0–29)
Microalbumin, Urine: 4.8 ug/mL

## 2022-08-23 LAB — TSH: TSH: 1.96 u[IU]/mL (ref 0.450–4.500)

## 2022-08-23 LAB — LIPID PANEL
Chol/HDL Ratio: 4.5 ratio — ABNORMAL HIGH (ref 0.0–4.4)
Cholesterol, Total: 242 mg/dL — ABNORMAL HIGH (ref 100–199)
HDL: 54 mg/dL (ref >39)
LDL Chol Calc (NIH): 168 mg/dL — ABNORMAL HIGH (ref 0–99)
Triglycerides: 111 mg/dL (ref 0–149)
VLDL Cholesterol Cal: 20 mg/dL (ref 5–40)

## 2022-09-12 ENCOUNTER — Ambulatory Visit (HOSPITAL_COMMUNITY)
Admission: RE | Admit: 2022-09-12 | Discharge: 2022-09-12 | Disposition: A | Discharge status: Home / Self Care | Payer: 59 | Source: Ambulatory Visit | Attending: Internal Medicine | Admitting: Internal Medicine

## 2022-09-12 ENCOUNTER — Encounter: Payer: Self-pay | Admitting: Internal Medicine

## 2022-09-12 DIAGNOSIS — E7800 Pure hypercholesterolemia, unspecified: Secondary | ICD-10-CM | POA: Insufficient documentation

## 2022-11-27 ENCOUNTER — Ambulatory Visit
Admission: RE | Admit: 2022-11-27 | Discharge: 2022-11-27 | Disposition: A | Discharge status: Home / Self Care | Payer: 59 | Source: Ambulatory Visit | Attending: Internal Medicine | Admitting: Internal Medicine

## 2022-11-27 DIAGNOSIS — Z1231 Encounter for screening mammogram for malignant neoplasm of breast: Secondary | ICD-10-CM

## 2022-12-06 ENCOUNTER — Telehealth (HOSPITAL_BASED_OUTPATIENT_CLINIC_OR_DEPARTMENT_OTHER): Payer: Self-pay | Admitting: *Deleted

## 2022-12-06 ENCOUNTER — Encounter (HOSPITAL_BASED_OUTPATIENT_CLINIC_OR_DEPARTMENT_OTHER): Payer: Self-pay | Admitting: Cardiovascular Disease

## 2022-12-06 ENCOUNTER — Ambulatory Visit (INDEPENDENT_AMBULATORY_CARE_PROVIDER_SITE_OTHER): Payer: 59 | Admitting: Cardiovascular Disease

## 2022-12-06 VITALS — BP 160/80 | HR 65 | Ht 63.0 in | Wt 162.7 lb

## 2022-12-06 DIAGNOSIS — I10 Essential (primary) hypertension: Secondary | ICD-10-CM | POA: Diagnosis not present

## 2022-12-06 DIAGNOSIS — E785 Hyperlipidemia, unspecified: Secondary | ICD-10-CM

## 2022-12-06 DIAGNOSIS — Z5181 Encounter for therapeutic drug level monitoring: Secondary | ICD-10-CM

## 2022-12-06 MED ORDER — SPIRONOLACTONE 25 MG PO TABS: 25.0000 mg | ORAL_TABLET | Freq: Every day | ORAL | 1 refills | Status: DC

## 2022-12-06 MED ORDER — REPATHA SURECLICK 140 MG/ML ~~LOC~~ SOAJ: 140.0000 mg | SUBCUTANEOUS | 3 refills | Status: DC

## 2022-12-06 NOTE — Patient Instructions (Addendum)
Medication Instructions:  STOP LISINOPRIL   START SPIRONOLACTONE 25 MG DAILY   START REPATHA 140 MG EVER 2 WEEKS   *If you need a refill on your cardiac medications before your next appointment, please call your pharmacy*  Lab Work: BMET IN 1 WEEK   FASTING LP/CMET IN 2 MONTHS ABOUT 1 WEEK PRIOR TO FOLLOW UP   If you have labs (blood work) drawn today and your tests are completely normal, you will receive your results only by: MyChart Message (if you have MyChart) OR A paper copy in the mail If you have any lab test that is abnormal or we need to change your treatment, we will call you to review the results.  Testing/Procedures: NONE  Follow-Up: 02/08/2023 1:30 PM WITH CAITLIN W NP  Other Instructions MONITOR BLOOD PRESSURE TWICE A DAY AND LOG. BRING LOG AND MACHINE TO FOLLOW UP

## 2022-12-06 NOTE — Telephone Encounter (Signed)
Patient started on Repatha today Will forward to prior authorization team to initiate PA

## 2022-12-06 NOTE — Progress Notes (Signed)
Cardiology Office Note:  .   Date:  12/06/2022  ID:  Victoria Strickland, DOB 1960-06-05, MRN [id number] PCP: Victoria Peng, MD  [facility] Providers Cardiologist:  None    History of Present Illness: .   Victoria Strickland is a 62 y.o. female with hypertension and hyperlipidemia who is being seen today for the evaluation of hyperlipidemia at the request of Victoria Peng, MD. she saw Dr. Allyne Strickland 08/2022 and blood pressures were controlled but lipids were uncontrolled.  Dr. Allyne Strickland recommended statins and she declined in favor of lifestyle modification.  She had a coronary calcium score 09/2022 that revealed a score of 61.6, which was 84th percentile.  She was referred to cardiology for further evaluation.  Today she notes concerns about the side effects of atorvastatin. The patient reported experiencing significant memory loss while on atorvastatin, which improved after discontinuing the medication. She has been trying to manage her cholesterol and blood pressure through diet and exercise, including daily walking as part of her job and additional exercise at home.  [name] reported no chest pain or breathing issues, and only occasional feelings of swelling in the feet, which resolve after urination. The patient has a family history of potential heart disease and a cousin who had a mild stroke in her 23s. Her diet includes oatmeal, deli [place], and occasional red meat, with a preference for home-cooked meals and minimal fried food.  [name] has a history of high blood pressure, which she monitors at home. The patient reported that her blood pressure is typically high, but can be normal after morning stretches.  She was previously on Lisinopril for blood pressure management, but expressed concerns about its potential side effects. The patient also reported a preference for herbal remedies when possible.  [name]' cholesterol management has been complicated by her adverse reaction to  atorvastatin. The patient reported a significant impact on her memory while on this medication, which improved after discontinuation. The patient has been reluctant to try other statins due to concerns about similar side effects. Despite these challenges, the patient is proactive in managing her health, including regular exercise and a diet low in fat and cholesterol.      ROS:  As per HPI.  Studies Reviewed: Marland Kitchen       Calcium score 09/2022: IMPRESSION: Coronary calcium score of 61.6. This was 84th percentile for age-, race-, and sex-matched controls.  Risk Assessment/Calculations:      Physical Exam:    VS:  BP (!) 160/80   Pulse 65   Ht 5\' 3"  (1.6 m)   Wt 162 lb 11.2 oz (73.8 kg)   SpO2 100%   BMI 28.82 kg/m  , BMI Body mass index is 28.82 kg/m. GENERAL:  Well appearing HEENT: Pupils equal round and reactive, fundi not visualized, oral mucosa unremarkable NECK:  No jugular venous distention, waveform within normal limits, carotid upstroke brisk and symmetric, no bruits, no thyromegaly LUNGS:  Clear to auscultation bilaterally HEART:  RRR.  PMI not displaced or sustained,S1 and S2 within normal limits, no S3, no S4, no clicks, no rubs, no murmurs ABD:  Flat, positive bowel sounds normal in frequency in pitch, no bruits, no rebound, no guarding, no midline pulsatile mass, no hepatomegaly, no splenomegaly EXT:  2 plus pulses throughout, no edema, no cyanosis no clubbing SKIN:  No rashes no nodules NEURO:  Cranial nerves II through XII grossly intact, motor grossly intact throughout PSYCH:  Cognitively intact, oriented to person place and  time   ASSESSMENT AND PLAN: .    # Coronary Artery Disease Calcium score 61.6, which is 84th percentile.  High calcium score indicating presence of plaque. No symptoms of angina or shortness of breath. Patient has previously tried and discontinued Atorvastatin due to memory issues.  She is unwilling to try other statins.  -Start Repatha  (non-statin cholesterol medication) with injections every two weeks. -Recheck lipid panel in two months.  # Hypertension Patient has been on Lisinopril, but blood pressure readings have been inconsistent. No symptoms of fluid overload. -Discontinue Lisinopril. -Start Spironolactone 25mg  daily in the morning. -Check BMP in one week to monitor potassium levels. -Provide patient with a blood pressure tracking sheet and instruct to check blood pressure twice daily.  # General Health Maintenance -Continue with current exercise regimen and dietary modifications. -Encourage limiting intake of red meat and high-sodium foods. -Bring home blood pressure monitor to next appointment for accuracy check.             Dispo: F/u With Victoria Shields, NP on 02/08/23  Signed, Chilton Si, MD

## 2022-12-07 ENCOUNTER — Other Ambulatory Visit (HOSPITAL_COMMUNITY): Payer: Self-pay

## 2022-12-07 ENCOUNTER — Telehealth: Payer: Self-pay

## 2022-12-07 NOTE — Telephone Encounter (Signed)
PA request has been Submitted. New Encounter created for follow up. For additional info see Pharmacy Prior Auth telephone encounter from 12/07/22.

## 2022-12-07 NOTE — Telephone Encounter (Signed)
Pt calling to f/u on Prior Auth for medication. She states that pharmacy will not let her get medication until PA is done. Pt would like a callback regarding this matter. Please advise

## 2022-12-07 NOTE — Telephone Encounter (Signed)
Pharmacy Patient Advocate Encounter  Received notification from [facility] that Prior Authorization for REPATHA has been APPROVED from 12/07/22 to 12/07/23. Ran test claim, Copay is $24.99 PT HAS A DEDUCTIBLE TO MEET. This test claim was processed through [facility]- copay amounts may vary at other pharmacies due to pharmacy/plan contracts, or as the patient moves through the different stages of their insurance plan.

## 2022-12-07 NOTE — Telephone Encounter (Signed)
Pharmacy Patient Advocate Encounter   Received notification from Physician's Office that prior authorization for REPATHA is required/requested.   Insurance verification completed.   The patient is insured through [facility] .   Per test claim: PA required; PA submitted to [facility] via CoverMyMeds Key/confirmation #/[id number] Status is pending

## 2022-12-07 NOTE — Telephone Encounter (Signed)
Pharmacy Patient Advocate Encounter   Received notification from [facility] that Prior Authorization for REPATHA has been APPROVED from 12/07/22 to 12/07/23. Ran test claim, Copay is $24.99 PT HAS A DEDUCTIBLE TO MEET. This test claim was processed through [facility]- copay amounts may vary at other pharmacies due to pharmacy/plan contracts, or as the patient moves through the different stages of their insurance plan.  Mychar message has been sent to patient

## 2022-12-08 NOTE — Telephone Encounter (Signed)
Patient read my chart message

## 2022-12-11 ENCOUNTER — Telehealth (HOSPITAL_BASED_OUTPATIENT_CLINIC_OR_DEPARTMENT_OTHER): Payer: Self-pay

## 2022-12-11 NOTE — Telephone Encounter (Signed)
-----   Message from Phillips Hay sent at 12/08/2022  3:41 PM EDT ----- This is fine if she wants to take. ----- Message ----- From: Burnell Blanks, LPN Sent: 11/02/9560   4:25 PM EDT To: Rosalee Kaufman, RPH-CPP; #  Hello all  Patient just started on Johnathan Hausen Procaps [employer] [facility] natural hormone balance. She wanted to make sure nothing in it that would cause her blood pressure to be elevated  Thanks   [place]

## 2022-12-11 NOTE — Telephone Encounter (Signed)
Follow up mychart message sent to patient.

## 2022-12-14 LAB — BASIC METABOLIC PANEL
BUN/Creatinine Ratio: 11 — ABNORMAL LOW (ref 12–28)
BUN: 11 mg/dL (ref 8–27)
CO2: 24 mmol/L (ref 20–29)
Calcium: 10.5 mg/dL — ABNORMAL HIGH (ref 8.7–10.3)
Chloride: 104 mmol/L (ref 96–106)
Creatinine, Ser: 0.99 mg/dL (ref 0.57–1.00)
Glucose: 89 mg/dL (ref 70–99)
Potassium: 4.5 mmol/L (ref 3.5–5.2)
Sodium: 141 mmol/L (ref 134–144)
eGFR: 64 mL/min/{1.73_m2} (ref >59)

## 2023-02-01 LAB — COMPREHENSIVE METABOLIC PANEL
ALT: 12 [IU]/L (ref 0–32)
AST: 24 [IU]/L (ref 0–40)
Albumin: 4.5 g/dL (ref 3.9–4.9)
Alkaline Phosphatase: 67 [IU]/L (ref 44–121)
BUN/Creatinine Ratio: 15 (ref 12–28)
BUN: 16 mg/dL (ref 8–27)
Bilirubin Total: 0.6 mg/dL (ref 0.0–1.2)
CO2: 21 mmol/L (ref 20–29)
Calcium: 10.2 mg/dL (ref 8.7–10.3)
Chloride: 105 mmol/L (ref 96–106)
Creatinine, Ser: 1.05 mg/dL — ABNORMAL HIGH (ref 0.57–1.00)
Globulin, Total: 2.8 g/dL (ref 1.5–4.5)
Glucose: 92 mg/dL (ref 70–99)
Potassium: 4.5 mmol/L (ref 3.5–5.2)
Sodium: 143 mmol/L (ref 134–144)
Total Protein: 7.3 g/dL (ref 6.0–8.5)
eGFR: 60 mL/min/{1.73_m2} (ref >59)

## 2023-02-01 LAB — LIPID PANEL
Chol/HDL Ratio: 2.4 {ratio} (ref 0.0–4.4)
Cholesterol, Total: 145 mg/dL (ref 100–199)
HDL: 60 mg/dL (ref >39)
LDL Chol Calc (NIH): 67 mg/dL (ref 0–99)
Triglycerides: 98 mg/dL (ref 0–149)
VLDL Cholesterol Cal: 18 mg/dL (ref 5–40)

## 2023-02-08 ENCOUNTER — Ambulatory Visit (HOSPITAL_BASED_OUTPATIENT_CLINIC_OR_DEPARTMENT_OTHER): Payer: 59 | Admitting: Family

## 2023-02-08 ENCOUNTER — Encounter (HOSPITAL_BASED_OUTPATIENT_CLINIC_OR_DEPARTMENT_OTHER): Payer: Self-pay | Admitting: Family

## 2023-02-08 VITALS — BP 122/72 | HR 64 | Ht 63.0 in | Wt 158.9 lb

## 2023-02-08 DIAGNOSIS — E785 Hyperlipidemia, unspecified: Secondary | ICD-10-CM

## 2023-02-08 DIAGNOSIS — I10 Essential (primary) hypertension: Secondary | ICD-10-CM | POA: Diagnosis not present

## 2023-02-08 DIAGNOSIS — I25118 Atherosclerotic heart disease of native coronary artery with other forms of angina pectoris: Secondary | ICD-10-CM | POA: Diagnosis not present

## 2023-02-08 NOTE — Progress Notes (Signed)
Advanced [facility]pertension [facility] Assessment:    Date:  02/08/2023   ID:  Victoria Strickland, Victoria Strickland April 23, 1960, MRN [id number]  PCP:  Dorothyann Peng, MD  Cardiologist:  Chilton Si, MD  Nephrologist:  Referring MD: Dorothyann Peng, MD   CC: Hypertension  History of Present Illness:    Victoria Strickland is a 62 y.o. female with a hx of hypertension, hyperlipidemia, coronary artery disease here to follow up in the [facility].   Coronary calcium score 09/2022 with calcium score 61.6 placing her in the 84th percentile. She was referred to cardiology by her PCP.   At initial visit noted concerns about side effects on Atorvastatin related to memory. She was also hesitant about Lisinopril side effects and preferred to manage naturally when possible. Lisinopril was stopped due to inconsistent BP readings. Spironolactone and Repatha initiated.   Presents today for follow up. Initial BP in clinic 122/72 with repeat 132/80. BP via home arm cuff 137/83. Feeling well on Spironolactone without side effects. Tolerating Repatha without issue. Discussed need for long term lipid control in coronary artery disease. Reports no shortness of breath nor dyspnea on exertion. Reports no chest pain, pressure, or tightness. No  orthopnea, PND. Reports no palpitations.  Some swelling in her feet late a night  Previous antihypertensives: Lisinopril   Past Medical History:  Diagnosis Date   COVID 05/2020   mild symptoms x few days all symptoms resolved   Essential hypertension, benign    Hematuria 08/27/2020   pt in er, issue resolved as of 10-07-2020   Hot flashes 10/07/2020   Hyperlipidemia    Pneumonia    yrs ago per pt on 10-07-2020   Right renal stone 10/07/2020   Wears dentures 10/07/2020   upper   Wears glasses 10/07/2020    Past Surgical History:  Procedure Laterality Date   CYSTOSCOPY WITH RETROGRADE PYELOGRAM, URETEROSCOPY AND STENT PLACEMENT Right 10/12/2020   Procedure:  CYSTOSCOPY WITH RETROGRADE PYELOGRAM, URETEROSCOPY AND STENT PLACEMENT AND LASER LITHOTRIPSY;  Surgeon: Noel Christmas, MD;  Location: [facility];  Service: Urology;  Laterality: Right;  1 HR   TONSILLECTOMY  1973    Current Medications: Current Meds  Medication Sig   Evolocumab (REPATHA SURECLICK) 140 MG/ML SOAJ Inject 140 mg into the skin every 14 (fourteen) days.   Multiple Vitamin (MULTIVITAMIN) LIQD Take 5 mLs by mouth daily.   OVER THE COUNTER MEDICATION Liquid vitamin c Advance immunity support   OVER THE COUNTER MEDICATION 5,000 mcg. Liquid vitamin b12   OVER THE COUNTER MEDICATION Vitamin d 3 liquid daily   spironolactone (ALDACTONE) 25 MG tablet Take 1 tablet (25 mg total) by mouth daily.     Allergies:   Atorvastatin   Social History   Socioeconomic History   Marital status: Married    Spouse name: Not on file   Number of children: Not on file   Years of education: Not on file   Highest education level: Not on file  Occupational History   Not on file  Tobacco Use   Smoking status: Never   Smokeless tobacco: Never  Vaping Use   Vaping status: Never Used  Substance and Sexual Activity   Alcohol use: Never   Drug use: Never   Sexual activity: Not on file  Other Topics Concern   Not on file  Social History Narrative   Not on file   Social Determinants of Health   Financial Resource Strain: Not on file  Food Insecurity:  No Food Insecurity (12/06/2022)   Hunger Vital Sign    Worried About Running Out of Food in the Last Year: Never true    Ran Out of Food in the Last Year: Never true  Transportation Needs: No Transportation Needs (12/06/2022)   PRAPARE - Administrator, Civil Service (Medical): No    Lack of Transportation (Non-Medical): No  Physical Activity: Sufficiently Active (12/06/2022)   Exercise Vital Sign    Days of Exercise per Week: 5 days    Minutes of Exercise per Session: 30 min  Stress: Not on file  Social  Connections: Not on file     Family History: The patient's family history includes Aneurysm in her father; Breast cancer in her cousin and another family member; Cancer in her mother and another family member; Fibroids in her mother; Heart disease in her maternal grandmother; Stroke in an other family member.  ROS:   Please see the history of present illness.     All other systems reviewed and are negative.  EKGs/Labs/Other Studies Reviewed:         Recent Labs: 07/21/2022: Magnesium 1.9 08/22/2022: Hemoglobin 12.3; Platelets 189; TSH 1.960 01/31/2023: ALT 12; BUN 16; Creatinine, Ser 1.05; Potassium 4.5; Sodium 143   Recent Lipid Panel    Component Value Date/Time   CHOL 145 01/31/2023 0801   TRIG 98 01/31/2023 0801   HDL 60 01/31/2023 0801   CHOLHDL 2.4 01/31/2023 0801   LDLCALC 67 01/31/2023 0801    Physical Exam:   VS:  BP 122/72   Pulse 64   Ht 5\' 3"  (1.6 m)   Wt 158 lb 14.4 oz (72.1 kg)   SpO2 96%   BMI 28.15 kg/m  , BMI Body mass index is 28.15 kg/m. GENERAL:  Well appearing HEENT: Pupils equal round and reactive, fundi not visualized, oral mucosa unremarkable NECK:  No jugular venous distention, waveform within normal limits, carotid upstroke brisk and symmetric, no bruits, no thyromegaly LYMPHATICS:  No cervical adenopathy LUNGS:  Clear to auscultation bilaterally HEART:  RRR.  PMI not displaced or sustained,S1 and S2 within normal limits, no S3, no S4, no clicks, no rubs, no murmurs ABD:  Flat, positive bowel sounds normal in frequency in pitch, no bruits, no rebound, no guarding, no midline pulsatile mass, no hepatomegaly, no splenomegaly EXT:  2 plus pulses throughout, no edema, no cyanosis no clubbing SKIN:  No rashes no nodules NEURO:  Cranial nerves II through XII grossly intact, motor grossly intact throughout PSYCH:  Cognitively intact, oriented to person place and time   ASSESSMENT/PLAN:    CAD / HLD, LDL goal <70 - Stable with no anginal symptoms.  No indication for ischemic evaluation.  Did not tolerate statin. Continue Repatha. We discussed long term nature of cholesterol medications.   HTN - BP well controlled. Continue current antihypertensive regimen Spironolactone 25mg  daily. Home BP cuff found to read +5 systolic and +3 diastolic. Discussed to monitor BP at home at least 2 hours after medications and sitting for 5-10 minutes. Recommend aiming for 150 minutes of moderate intensity activity per week and following a heart healthy diet.     Screening for Secondary Hypertension:     Relevant Labs/Studies:    Latest Ref Rng & Units 01/31/2023    8:01 AM 12/13/2022    4:36 PM 07/21/2022    7:58 PM  Basic Labs  Sodium 134 - 144 mmol/L 143  141  138   Potassium 3.5 - 5.2 mmol/L 4.5  4.5  3.7   Creatinine 0.57 - 1.00 mg/dL 2.72  5.36  6.44        Latest Ref Rng & Units 08/22/2022    9:34 AM 07/22/2018   11:51 AM  Thyroid   TSH 0.450 - 4.500 uIU/mL 1.960  2.040                   Disposition:    FU with MD/PharmD in 4 months    Medication Adjustments/Labs and Tests Ordered: Current medicines are reviewed at length with the patient today.  Concerns regarding medicines are outlined above.  No orders of the defined types were placed in this encounter.  No orders of the defined types were placed in this encounter.    Signed, Alver Sorrow, NP  02/08/2023 6:24 PM    [facility]

## 2023-02-08 NOTE — Patient Instructions (Addendum)
Medication Instructions:  Your physician recommends that you continue on your current medications as directed. Please refer to the Current Medication list given to you today.  *If you need a refill on your cardiac medications before your next appointment, please call your pharmacy*   Follow-Up: At [facility], you and your health needs are our priority.  As part of our continuing mission to provide you with exceptional heart care, we have created designated Provider Care Teams.  These Care Teams include your primary Cardiologist (physician) and Advanced Practice Providers (APPs -  Physician Assistants and Nurse Practitioners) who all work together to provide you with the care you need, when you need it.  We recommend signing up for the patient portal called "MyChart".  Sign up information is provided on this After Visit Summary.  MyChart is used to connect with patients for Virtual Visits (Telemedicine).  Patients are able to view lab/test results, encounter notes, upcoming appointments, etc.  Non-urgent messages can be sent to your provider as well.   To learn more about what you can do with MyChart, go to [email or web address].    Your next appointment:   4 month(s) (MARCH: [facility]N [facility])  Provider:   Chilton Si, MD  Other Instructions Your cuff: 137/83 Manual cuff: 132/80

## 2023-02-22 ENCOUNTER — Ambulatory Visit: Payer: 59 | Admitting: Internal Medicine

## 2023-05-22 ENCOUNTER — Other Ambulatory Visit (HOSPITAL_BASED_OUTPATIENT_CLINIC_OR_DEPARTMENT_OTHER): Payer: Self-pay | Admitting: Cardiovascular Disease

## 2023-06-08 ENCOUNTER — Encounter (HOSPITAL_BASED_OUTPATIENT_CLINIC_OR_DEPARTMENT_OTHER): Payer: Self-pay

## 2023-06-11 ENCOUNTER — Ambulatory Visit (HOSPITAL_BASED_OUTPATIENT_CLINIC_OR_DEPARTMENT_OTHER): Payer: 59 | Admitting: Cardiovascular Disease

## 2023-06-11 VITALS — BP 140/68 | HR 63 | Ht 63.0 in | Wt 158.0 lb

## 2023-06-11 DIAGNOSIS — I10 Essential (primary) hypertension: Secondary | ICD-10-CM

## 2023-06-11 DIAGNOSIS — Z5181 Encounter for therapeutic drug level monitoring: Secondary | ICD-10-CM | POA: Diagnosis not present

## 2023-06-11 DIAGNOSIS — E785 Hyperlipidemia, unspecified: Secondary | ICD-10-CM | POA: Diagnosis not present

## 2023-06-11 NOTE — Progress Notes (Unsigned)
 Cardiology Office Note:  .   Date:  07/05/2023  ID:  Victoria, Strickland 26-Feb-1961, MRN [id number] PCP: Victoria Peng, MD   HeartCare Providers Cardiologist:  Chilton Si, MD    History of Present Illness: .   Victoria Strickland is a 63 y.o. female with coronary calcification, hypertension and hyperlipidemia here for follow up.  SHe waswho is being seen today for the evaluation of hyperlipidemia at the request of Victoria Peng, MD. she saw Dr. Allyne Gee 08/2022 and blood pressures were controlled but lipids were uncontrolled.  Dr. Allyne Gee recommended statins and she declined in favor of lifestyle modification.  She had a coronary calcium score 09/2022 that revealed a score of 61.6, which was 84th percentile.  She was referred to cardiology for further evaluation.  She noted memory loss on atorvastatin.  She was unwilling to try any other statins.  She was started on Repatha.  Her lipids have been well-controlled since that time.  Blood pressure is not controlled on lisinopril and she was switched to spironolactone.  She followed up with Victoria Shields, NP and BP was better controlled 02/2023.   [name] has been experiencing elevated blood pressure, which she attributes to dietary choices, specifically mentioning having food from [employer]. She has not been checking her blood pressure at home recently due to a change in her blood pressure machine, which was replaced about a week ago. She plans to resume monitoring her blood pressure regularly.  She mentions experiencing aching in her thigh, which she associates with her Repatha injections. She describes difficulty finding suitable areas for injection due to low body fat, often injecting in the thigh where she believes she might be hitting muscle. She typically alternates injection sites between her abdomen and thighs.  Regarding her diet, she describes herself as not a big eater and mentions losing weight quickly when she does not  eat. She recently lost 5-7 pounds during a fasting period in January and is trying to regain weight by eating more bread.  She discusses her exercise routine, which includes using a treadmill, elliptical, and weights at home. She reports no issues while exercising and notes that she does not sweat much during these activities. She has also been running up and down stairs at home.  She shares concerns about her mother, Victoria Strickland, who experienced a sudden decline in memory and physical health over a month ago, leading to hospitalization and rehabilitation. Her mother is currently weak and undergoing therapy.  No chest pain, breathing difficulties, or leg swelling.      ROS:  As per HPI.  Studies Reviewed: Marland Kitchen       Calcium score 09/2022: IMPRESSION: Coronary calcium score of 61.6. This was 84th percentile for age-, race-, and sex-matched controls.  Risk Assessment/Calculations:      Physical Exam:    VS:  BP (!) 140/68   Pulse 63   Ht 5\' 3"  (1.6 m)   Wt 158 lb (71.7 kg)   SpO2 100%   BMI 27.99 kg/m  , BMI Body mass index is 27.99 kg/m. GENERAL:  Well appearing HEENT: Pupils equal round and reactive, fundi not visualized, oral mucosa unremarkable NECK:  No jugular venous distention, waveform within normal limits, carotid upstroke brisk and symmetric, no bruits, no thyromegaly LUNGS:  Clear to auscultation bilaterally HEART:  RRR.  PMI not displaced or sustained,S1 and S2 within normal limits, no S3, no S4, no clicks, no rubs, no murmurs ABD:  Flat, positive bowel sounds  normal in frequency in pitch, no bruits, no rebound, no guarding, no midline pulsatile mass, no hepatomegaly, no splenomegaly EXT:  2 plus pulses throughout, no edema, no cyanosis no clubbing SKIN:  No rashes no nodules NEURO:  Cranial nerves II through XII grossly intact, motor grossly intact throughout PSYCH:  Cognitively intact, oriented to person place and time   ASSESSMENT AND PLAN: .       #  Hypertension Blood pressure elevated at 140/68, exceeding target of <130/80. Home monitoring interrupted due to equipment change. - Check blood pressure at home twice daily for two weeks. - Upload blood pressure readings to MyChart. - Adjust medication dosage if home readings remain elevated. -Continue spirinonlactone  # Hyperlipidemia Cholesterol levels improved with Repatha. Correct injection technique achieved. Advised to alternate injection sites to avoid intramuscular injection. - Continue Repatha injections. - Alternate injection sites between left and right abdomen.  # Weight loss Unintentional weight loss of 5-7 pounds after fasting. Skipping meals contributes to weight loss. - Increase caloric intake, particularly with bread, to regain weight.  # Exercise regimen Regular exercise with treadmill, elliptical, and weights. Lacks sweating or increased heart rate during workouts. - Increase treadmill incline to enhance workout intensity. - Monitor heart rate during exercise.        Dispo: f/u in 6 months.  Signed, Chilton Si, MD

## 2023-06-11 NOTE — Patient Instructions (Signed)
 Medication Instructions:  Your physician recommends that you continue on your current medications as directed. Please refer to the Current Medication list given to you today.   Labwork: FASTING LP/CMET IN 6 MONTHS ABOUT A WEEK PRIOR TO FOLLOW UP   Testing/Procedures: NONE   Follow-Up: 6 MONTHS WITH DR Touchet OR Ronn Melena NP   Any Other Special Instructions Will Be Listed Below (If Applicable). MONITOR YOUR BLOOD PRESSURE TWICE A DAY FOR 2 WEEKS  CALL OR MYCHART MESSAGE WITH READINGS   If you need a refill on your cardiac medications before your next appointment, please call your pharmacy.

## 2023-07-05 ENCOUNTER — Encounter (HOSPITAL_BASED_OUTPATIENT_CLINIC_OR_DEPARTMENT_OTHER): Payer: Self-pay | Admitting: Cardiovascular Disease

## 2023-07-23 ENCOUNTER — Encounter (HOSPITAL_BASED_OUTPATIENT_CLINIC_OR_DEPARTMENT_OTHER): Payer: Self-pay | Admitting: Cardiovascular Disease

## 2023-08-02 ENCOUNTER — Encounter (HOSPITAL_BASED_OUTPATIENT_CLINIC_OR_DEPARTMENT_OTHER): Payer: Self-pay | Admitting: Cardiovascular Disease

## 2023-08-20 ENCOUNTER — Other Ambulatory Visit: Payer: Self-pay | Admitting: Internal Medicine

## 2023-08-20 DIAGNOSIS — Z1231 Encounter for screening mammogram for malignant neoplasm of breast: Secondary | ICD-10-CM

## 2023-08-28 ENCOUNTER — Encounter: Payer: Self-pay | Admitting: Internal Medicine

## 2023-10-02 ENCOUNTER — Ambulatory Visit (INDEPENDENT_AMBULATORY_CARE_PROVIDER_SITE_OTHER): Admitting: Internal Medicine

## 2023-10-02 ENCOUNTER — Encounter: Payer: Self-pay | Admitting: Internal Medicine

## 2023-10-02 VITALS — BP 116/64 | Temp 98.0°F | Ht 64.0 in | Wt 151.8 lb

## 2023-10-02 DIAGNOSIS — E7800 Pure hypercholesterolemia, unspecified: Secondary | ICD-10-CM

## 2023-10-02 DIAGNOSIS — R634 Abnormal weight loss: Secondary | ICD-10-CM | POA: Diagnosis not present

## 2023-10-02 DIAGNOSIS — Z8249 Family history of ischemic heart disease and other diseases of the circulatory system: Secondary | ICD-10-CM

## 2023-10-02 DIAGNOSIS — Z0000 Encounter for general adult medical examination without abnormal findings: Secondary | ICD-10-CM

## 2023-10-02 DIAGNOSIS — R8290 Unspecified abnormal findings in urine: Secondary | ICD-10-CM | POA: Diagnosis not present

## 2023-10-02 DIAGNOSIS — I10 Essential (primary) hypertension: Secondary | ICD-10-CM | POA: Diagnosis not present

## 2023-10-02 LAB — POCT URINALYSIS DIP (CLINITEK)
Bilirubin, UA: NEGATIVE
Glucose, UA: NEGATIVE mg/dL
Ketones, POC UA: NEGATIVE mg/dL
Nitrite, UA: POSITIVE — AB
POC PROTEIN,UA: 30 — AB
Spec Grav, UA: 1.02 (ref 1.010–1.025)
Urobilinogen, UA: 0.2 U/dL
pH, UA: 5.5 (ref 5.0–8.0)

## 2023-10-02 NOTE — Assessment & Plan Note (Signed)

## 2023-10-02 NOTE — Progress Notes (Signed)
 I,Victoria T Emmitt, CMA,acting as a Neurosurgeon for Catheryn LOISE Slocumb, MD.,have documented all relevant documentation on the behalf of Catheryn LOISE Slocumb, MD,as directed by  Catheryn LOISE Slocumb, MD while in the presence of Catheryn LOISE Slocumb, MD.  Subjective:    Patient ID: Victoria Strickland , female    DOB: 09-07-1960 , 63 y.o.   MRN: [id number]  Chief Complaint  Patient presents with   Annual Exam    Patient presents today for annual exam. She reports compliance with medications. Denies headache, chest pain & sob. She has no specific questions or concerns.  GYN: DR Estelle   Hypertension   Hyperlipidemia    HPI Discussed the use of AI scribe software for clinical note transcription with the patient, who gave verbal consent to proceed.  History of Present Illness Victoria Strickland is a 63 year old female who presents for an annual physical exam and blood pressure check.  She feels generally well with no specific concerns. She is currently on Repatha , administered biweekly in her abdomen, and spironolactone  for blood pressure management. Home blood pressure readings are typically around 110/70 mmHg, with a maximum of 117 mmHg.  Since March, she has experienced a weight loss of seven pounds, which she attributes to a decreased appetite. She sometimes skips dinner after having breakfast and lunch but denies any significant change in appetite, stating 'I've never been a big eater anyway.'  Her exercise routine includes strength training, and she is not taking any additional protein supplements. Her diet consists of oatmeal for breakfast and chicken as the primary meat, with occasional consumption of tuna, salmon, and hamburger. She does not eat eggs but consumes a protein snack of nuts.  There is a family history of heart disease and breast cancer, though specific relatives are not clearly identified. She has had a cardiac calcium  score in 2024. She performs regular breast exams and has a mammogram  scheduled soon.  She is under the care of Dr. Estelle for gynecological health and had a check-up last month, though not a full Pap smear. No concerns in this area.   Hypertension This is a chronic problem. The current episode started more than 1 year ago. The problem has been gradually improving since onset. The problem is controlled. Pertinent negatives include no blurred vision. Risk factors for coronary artery disease include post-menopausal state. The current treatment provides moderate improvement. There is no history of kidney disease or CAD/MI.     Past Medical History:  Diagnosis Date   COVID 05/2020   mild symptoms x few days all symptoms resolved   Essential hypertension, benign    Hematuria 08/27/2020   pt in er, issue resolved as of 10-07-2020   Hot flashes 10/07/2020   Hyperlipidemia    Pneumonia    yrs ago per pt on 10-07-2020   Right renal stone 10/07/2020   Wears dentures 10/07/2020   upper   Wears glasses 10/07/2020     Family History  Problem Relation Age of Onset   Fibroids Mother    Cancer Mother        unknown   Aneurysm Father    Heart disease Maternal Grandmother    Breast cancer Cousin        maternal side   Cancer Other        maternal   Breast cancer Other    Stroke Other      Current Outpatient Medications:    Evolocumab  (REPATHA  SURECLICK) 140 MG/ML SOAJ, Inject  140 mg into the skin every 14 (fourteen) days., Disp: 6 mL, Rfl: 3   Multiple Vitamin (MULTIVITAMIN) LIQD, Take 5 mLs by mouth daily., Disp: , Rfl:    OVER THE COUNTER MEDICATION, Liquid vitamin c Advance immunity support, Disp: , Rfl:    OVER THE COUNTER MEDICATION, 5,000 mcg. Liquid vitamin b12, Disp: , Rfl:    OVER THE COUNTER MEDICATION, Vitamin d  3 liquid daily, Disp: , Rfl:    spironolactone  (ALDACTONE ) 25 MG tablet, TAKE 1 TABLET(25 MG) BY MOUTH DAILY, Disp: 90 tablet, Rfl: 1   Allergies  Allergen Reactions   Atorvastatin      Memory loss      The patient states she  uses none for birth control. No LMP recorded. Patient is postmenopausal.. Negative for Dysmenorrhea. Negative for: breast discharge, breast lump(s), breast pain and breast self exam. Associated symptoms include abnormal vaginal bleeding. Pertinent negatives include abnormal bleeding (hematology), anxiety, decreased libido, depression, difficulty falling sleep, dyspareunia, history of infertility, nocturia, sexual dysfunction, sleep disturbances, urinary incontinence, urinary urgency, vaginal discharge and vaginal itching. Diet regular.The patient states her exercise level is  moderate.  . The patient's tobacco use is:  Social History   Tobacco Use  Smoking Status Never  Smokeless Tobacco Never  . She has been exposed to passive smoke. The patient's alcohol use is:  Social History   Substance and Sexual Activity  Alcohol Use Never   Review of Systems  Constitutional: Negative.   HENT: Negative.    Eyes: Negative.  Negative for blurred vision.  Respiratory: Negative.    Cardiovascular: Negative.   Gastrointestinal: Negative.   Endocrine: Negative.   Genitourinary: Negative.   Musculoskeletal: Negative.   Skin: Negative.   Allergic/Immunologic: Negative.   Neurological: Negative.   Hematological: Negative.   Psychiatric/Behavioral: Negative.       Today's Vitals   10/02/23 1145  BP: 116/64  Temp: 98 F (36.7 C)  SpO2: 98%  Weight: 151 lb 12.8 oz (68.9 kg)  Height: 5' 4 (1.626 m)   Body mass index is 26.06 kg/m.  Wt Readings from Last 3 Encounters:  10/02/23 151 lb 12.8 oz (68.9 kg)  06/11/23 158 lb (71.7 kg)  02/08/23 158 lb 14.4 oz (72.1 kg)     Objective:  Physical Exam Vitals and nursing note reviewed.  Constitutional:      Appearance: Normal appearance.  HENT:     Head: Normocephalic and atraumatic.     Right Ear: Tympanic membrane, ear canal and external ear normal.     Left Ear: Tympanic membrane, ear canal and external ear normal.     Nose: Nose normal.      Comments: Masked     Mouth/Throat:     Mouth: Mucous membranes are moist.     Pharynx: Oropharynx is clear.     Comments: Masked  Eyes:     Extraocular Movements: Extraocular movements intact.     Conjunctiva/sclera: Conjunctivae normal.     Pupils: Pupils are equal, round, and reactive to light.  Cardiovascular:     Rate and Rhythm: Normal rate and regular rhythm.     Pulses: Normal pulses.     Heart sounds: Normal heart sounds.  Pulmonary:     Effort: Pulmonary effort is normal.     Breath sounds: Normal breath sounds.  Chest:  Breasts:    Tanner Score is 5.     Right: Normal.     Left: Normal.  Abdominal:     General: Abdomen is flat.  Bowel sounds are normal.     Palpations: Abdomen is soft.  Genitourinary:    Comments: deferred Musculoskeletal:        General: Normal range of motion.     Cervical back: Normal range of motion and neck supple.  Skin:    General: Skin is warm and dry.  Neurological:     General: No focal deficit present.     Mental Status: She is alert and oriented to person, place, and time.  Psychiatric:        Mood and Affect: Mood normal.        Behavior: Behavior normal.       Assessment And Plan:     Routine general medical examination at health care facility Assessment & Plan: A full exam was performed.  Importance of monthly self breast exams was discussed with the patient.  She is advised to get 30-45 minutes of regular exercise, no less than four to five days per week. Both weight-bearing and aerobic exercises are recommended.  She is advised to follow a healthy diet with at least six fruits/veggies per day, decrease intake of red meat and other saturated fats and to increase fish intake to twice weekly.  Meats/fish should not be fried -- baked, boiled or broiled is preferable. It is also important to cut back on your sugar intake.  Be sure to read labels - try to avoid anything with added sugar, high fructose corn syrup or other sweeteners.   If you must use a sweetener, you can try stevia or monkfruit.  It is also important to avoid artificially sweetened foods/beverages and diet drinks. Lastly, wear SPF 50 sunscreen on exposed skin and when in direct sunlight for an extended period of time.  Be sure to avoid fast food restaurants and aim for at least 60 ounces of water daily.      Orders: -     CBC -     CMP14+EGFR -     Lipid panel -     TSH  Essential hypertension, benign Assessment & Plan: Chronic, well controlled.  EKG performed, NSR w/o acute changes.  Home readings around 110/70 mmHg. Advised hydration in hot weather. - Continue spironolactone . - Refill spironolactone  prescription by mid-August.  Orders: -     POCT URINALYSIS DIP (CLINITEK) -     Microalbumin / creatinine urine ratio -     EKG 12-Lead  Pure hypercholesterolemia Assessment & Plan: On Repatha . Coronary artery calcifications present. LDL goal uncertain. Lipoprotein A test suggested for genetic assessment. - Continue Repatha . - Order lipoprotein A test. - Check LDL goal with Dr. Raford.   Unintentional weight loss Assessment & Plan: Lost 7 pounds since March without dietary changes. Appetite sometimes reduced. Engaging in strength training but may lack adequate protein. Thyroid  function to be checked for hyperthyroidism. - Order thyroid  function tests. - Encourage adequate protein intake, especially with strength training. - Consider adding egg whites to diet for additional protein.   Abnormal urinalysis -     Urine Culture    Return for 1 year physical(WTM), 6 month bp. Patient was given opportunity to ask questions. Patient verbalized understanding of the plan and was able to repeat key elements of the plan. All questions were answered to their satisfaction.   I, Catheryn LOISE Slocumb, MD, have reviewed all documentation for this visit. The documentation on 10/02/23 for the exam, diagnosis, procedures, and orders are all accurate and  complete.

## 2023-10-02 NOTE — Patient Instructions (Signed)

## 2023-10-03 ENCOUNTER — Encounter (HOSPITAL_BASED_OUTPATIENT_CLINIC_OR_DEPARTMENT_OTHER): Payer: Self-pay | Admitting: Cardiovascular Disease

## 2023-10-03 LAB — CMP14+EGFR
ALT: 8 IU/L (ref 0–32)
AST: 17 IU/L (ref 0–40)
Albumin: 4.4 g/dL (ref 3.9–4.9)
Alkaline Phosphatase: 67 IU/L (ref 44–121)
BUN/Creatinine Ratio: 13 (ref 12–28)
BUN: 15 mg/dL (ref 8–27)
Bilirubin Total: 0.5 mg/dL (ref 0.0–1.2)
CO2: 20 mmol/L (ref 20–29)
Calcium: 9.9 mg/dL (ref 8.7–10.3)
Chloride: 102 mmol/L (ref 96–106)
Creatinine, Ser: 1.2 mg/dL — ABNORMAL HIGH (ref 0.57–1.00)
Globulin, Total: 3 g/dL (ref 1.5–4.5)
Glucose: 79 mg/dL (ref 70–99)
Potassium: 3.9 mmol/L (ref 3.5–5.2)
Sodium: 141 mmol/L (ref 134–144)
Total Protein: 7.4 g/dL (ref 6.0–8.5)
eGFR: 51 mL/min/{1.73_m2} — ABNORMAL LOW (ref >59)

## 2023-10-03 LAB — MICROALBUMIN / CREATININE URINE RATIO
Creatinine, Urine: 268.4 mg/dL
Microalb/Creat Ratio: 19 mg/g{creat} (ref 0–29)
Microalbumin, Urine: 51.9 ug/mL

## 2023-10-03 LAB — CBC
Hematocrit: 33.9 % — ABNORMAL LOW (ref 34.0–46.6)
Hemoglobin: 10.8 g/dL — ABNORMAL LOW (ref 11.1–15.9)
MCH: 26.7 pg (ref 26.6–33.0)
MCHC: 31.9 g/dL (ref 31.5–35.7)
MCV: 84 fL (ref 79–97)
Platelets: 222 10*3/uL (ref 150–450)
RBC: 4.05 x10E6/uL (ref 3.77–5.28)
RDW: 12.1 % (ref 11.7–15.4)
WBC: 7.2 10*3/uL (ref 3.4–10.8)

## 2023-10-03 LAB — LIPID PANEL
Chol/HDL Ratio: 2.4 ratio (ref 0.0–4.4)
Cholesterol, Total: 110 mg/dL (ref 100–199)
HDL: 45 mg/dL (ref >39)
LDL Chol Calc (NIH): 45 mg/dL (ref 0–99)
Triglycerides: 107 mg/dL (ref 0–149)
VLDL Cholesterol Cal: 20 mg/dL (ref 5–40)

## 2023-10-03 LAB — TSH: TSH: 0.99 u[IU]/mL (ref 0.450–4.500)

## 2023-10-03 NOTE — Telephone Encounter (Signed)
 She can hold for a month and see if she feels any different.  If she feels better she needs to let us  know so we can discuss other options.  If she feels the same, it's not Repatha  and she should re-start.

## 2023-10-05 LAB — URINE CULTURE

## 2023-10-06 ENCOUNTER — Ambulatory Visit: Payer: Self-pay | Admitting: Internal Medicine

## 2023-10-06 DIAGNOSIS — R634 Abnormal weight loss: Secondary | ICD-10-CM | POA: Insufficient documentation

## 2023-10-06 MED ORDER — LEVOFLOXACIN 250 MG PO TABS: 250.0000 mg | ORAL_TABLET | Freq: Every day | ORAL | 0 refills | Status: AC

## 2023-10-06 NOTE — Assessment & Plan Note (Signed)
 Lost 7 pounds since March without dietary changes. Appetite sometimes reduced. Engaging in strength training but may lack adequate protein. Thyroid  function to be checked for hyperthyroidism. - Order thyroid  function tests. - Encourage adequate protein intake, especially with strength training. - Consider adding egg whites to diet for additional protein.

## 2023-10-06 NOTE — Assessment & Plan Note (Signed)
 On Repatha . Coronary artery calcifications present. LDL goal uncertain. Lipoprotein A test suggested for genetic assessment. - Continue Repatha . - Order lipoprotein A test. - Check LDL goal with Dr. Raford.

## 2023-10-06 NOTE — Assessment & Plan Note (Signed)
 Chronic, well controlled.  EKG performed, NSR w/o acute changes.  Home readings around 110/70 mmHg. Advised hydration in hot weather. - Continue spironolactone . - Refill spironolactone  prescription by mid-August.

## 2023-10-08 ENCOUNTER — Encounter: Payer: Self-pay | Admitting: Internal Medicine

## 2023-10-12 ENCOUNTER — Encounter: Payer: Self-pay | Admitting: Internal Medicine

## 2023-10-24 ENCOUNTER — Emergency Department (HOSPITAL_COMMUNITY)

## 2023-10-24 ENCOUNTER — Emergency Department (HOSPITAL_BASED_OUTPATIENT_CLINIC_OR_DEPARTMENT_OTHER): Admitting: Radiology

## 2023-10-24 ENCOUNTER — Emergency Department (HOSPITAL_COMMUNITY): Admitting: Anesthesiology

## 2023-10-24 ENCOUNTER — Other Ambulatory Visit: Payer: Self-pay

## 2023-10-24 ENCOUNTER — Encounter (HOSPITAL_COMMUNITY)
Admission: EM | Disposition: A | Discharge status: Home / Self Care | Payer: Self-pay | Source: Home / Self Care | Attending: Internal Medicine

## 2023-10-24 ENCOUNTER — Inpatient Hospital Stay (HOSPITAL_BASED_OUTPATIENT_CLINIC_OR_DEPARTMENT_OTHER)
Admission: EM | Admit: 2023-10-24 | Discharge: 2023-10-27 | DRG: 853 | Disposition: A | Discharge status: Home / Self Care | Attending: Internal Medicine | Admitting: Internal Medicine

## 2023-10-24 ENCOUNTER — Emergency Department (HOSPITAL_BASED_OUTPATIENT_CLINIC_OR_DEPARTMENT_OTHER)

## 2023-10-24 ENCOUNTER — Encounter (HOSPITAL_BASED_OUTPATIENT_CLINIC_OR_DEPARTMENT_OTHER): Payer: Self-pay | Admitting: Emergency Medicine

## 2023-10-24 DIAGNOSIS — D649 Anemia, unspecified: Secondary | ICD-10-CM | POA: Diagnosis present

## 2023-10-24 DIAGNOSIS — Z888 Allergy status to other drugs, medicaments and biological substances status: Secondary | ICD-10-CM

## 2023-10-24 DIAGNOSIS — D6959 Other secondary thrombocytopenia: Secondary | ICD-10-CM | POA: Diagnosis present

## 2023-10-24 DIAGNOSIS — N3001 Acute cystitis with hematuria: Secondary | ICD-10-CM | POA: Diagnosis not present

## 2023-10-24 DIAGNOSIS — N179 Acute kidney failure, unspecified: Secondary | ICD-10-CM | POA: Diagnosis present

## 2023-10-24 DIAGNOSIS — A419 Sepsis, unspecified organism: Secondary | ICD-10-CM | POA: Diagnosis present

## 2023-10-24 DIAGNOSIS — N170 Acute kidney failure with tubular necrosis: Secondary | ICD-10-CM | POA: Diagnosis present

## 2023-10-24 DIAGNOSIS — N201 Calculus of ureter: Secondary | ICD-10-CM | POA: Diagnosis present

## 2023-10-24 DIAGNOSIS — R6520 Severe sepsis without septic shock: Secondary | ICD-10-CM | POA: Diagnosis present

## 2023-10-24 DIAGNOSIS — I10 Essential (primary) hypertension: Secondary | ICD-10-CM | POA: Diagnosis present

## 2023-10-24 DIAGNOSIS — Z8616 Personal history of COVID-19: Secondary | ICD-10-CM | POA: Diagnosis not present

## 2023-10-24 DIAGNOSIS — Z743 Need for continuous supervision: Secondary | ICD-10-CM | POA: Diagnosis not present

## 2023-10-24 DIAGNOSIS — R5383 Other fatigue: Secondary | ICD-10-CM | POA: Diagnosis present

## 2023-10-24 DIAGNOSIS — E861 Hypovolemia: Secondary | ICD-10-CM | POA: Diagnosis present

## 2023-10-24 DIAGNOSIS — E871 Hypo-osmolality and hyponatremia: Secondary | ICD-10-CM | POA: Diagnosis present

## 2023-10-24 DIAGNOSIS — Z823 Family history of stroke: Secondary | ICD-10-CM

## 2023-10-24 DIAGNOSIS — R531 Weakness: Secondary | ICD-10-CM | POA: Diagnosis not present

## 2023-10-24 DIAGNOSIS — R319 Hematuria, unspecified: Secondary | ICD-10-CM | POA: Diagnosis present

## 2023-10-24 DIAGNOSIS — A4181 Sepsis due to Enterococcus: Secondary | ICD-10-CM | POA: Diagnosis not present

## 2023-10-24 DIAGNOSIS — N200 Calculus of kidney: Secondary | ICD-10-CM | POA: Diagnosis present

## 2023-10-24 DIAGNOSIS — R6521 Severe sepsis with septic shock: Secondary | ICD-10-CM | POA: Diagnosis present

## 2023-10-24 DIAGNOSIS — Z803 Family history of malignant neoplasm of breast: Secondary | ICD-10-CM | POA: Diagnosis not present

## 2023-10-24 DIAGNOSIS — Z8249 Family history of ischemic heart disease and other diseases of the circulatory system: Secondary | ICD-10-CM | POA: Diagnosis not present

## 2023-10-24 DIAGNOSIS — Z79899 Other long term (current) drug therapy: Secondary | ICD-10-CM

## 2023-10-24 DIAGNOSIS — Z809 Family history of malignant neoplasm, unspecified: Secondary | ICD-10-CM

## 2023-10-24 DIAGNOSIS — N390 Urinary tract infection, site not specified: Secondary | ICD-10-CM | POA: Diagnosis present

## 2023-10-24 DIAGNOSIS — Z8701 Personal history of pneumonia (recurrent): Secondary | ICD-10-CM | POA: Diagnosis not present

## 2023-10-24 DIAGNOSIS — E785 Hyperlipidemia, unspecified: Secondary | ICD-10-CM | POA: Diagnosis present

## 2023-10-24 LAB — URINALYSIS, W/ REFLEX TO CULTURE (INFECTION SUSPECTED)
Bilirubin Urine: NEGATIVE
Glucose, UA: NEGATIVE mg/dL
Ketones, ur: NEGATIVE mg/dL
Nitrite: NEGATIVE
Protein, ur: 30 mg/dL — AB
Specific Gravity, Urine: 1.01 (ref 1.005–1.030)
Trans Epithel, UA: 2
pH: 5.5 (ref 5.0–8.0)

## 2023-10-24 LAB — CBC WITH DIFFERENTIAL/PLATELET
Abs Immature Granulocytes: 0.06 K/uL (ref 0.00–0.07)
Basophils Absolute: 0 K/uL (ref 0.0–0.1)
Basophils Relative: 0 %
Eosinophils Absolute: 0 K/uL (ref 0.0–0.5)
Eosinophils Relative: 0 %
HCT: 27.9 % — ABNORMAL LOW (ref 36.0–46.0)
Hemoglobin: 9.3 g/dL — ABNORMAL LOW (ref 12.0–15.0)
Immature Granulocytes: 1 %
Lymphocytes Relative: 10 %
Lymphs Abs: 1.2 K/uL (ref 0.7–4.0)
MCH: 26.1 pg (ref 26.0–34.0)
MCHC: 33.3 g/dL (ref 30.0–36.0)
MCV: 78.4 fL — ABNORMAL LOW (ref 80.0–100.0)
Monocytes Absolute: 0.8 K/uL (ref 0.1–1.0)
Monocytes Relative: 6 %
Neutro Abs: 10.1 K/uL — ABNORMAL HIGH (ref 1.7–7.7)
Neutrophils Relative %: 83 %
Platelets: 154 K/uL (ref 150–400)
RBC: 3.56 MIL/uL — ABNORMAL LOW (ref 3.87–5.11)
RDW: 13.7 % (ref 11.5–15.5)
WBC: 12.1 K/uL — ABNORMAL HIGH (ref 4.0–10.5)
nRBC: 0 % (ref 0.0–0.2)

## 2023-10-24 LAB — RESP PANEL BY RT-PCR (RSV, FLU A&B, COVID)  RVPGX2
Influenza A by PCR: NEGATIVE
Influenza B by PCR: NEGATIVE
Resp Syncytial Virus by PCR: NEGATIVE
SARS Coronavirus 2 by RT PCR: NEGATIVE

## 2023-10-24 LAB — COMPREHENSIVE METABOLIC PANEL WITH GFR
ALT: 38 U/L (ref 0–44)
AST: 64 U/L — ABNORMAL HIGH (ref 15–41)
Albumin: 3.9 g/dL (ref 3.5–5.0)
Alkaline Phosphatase: 56 U/L (ref 38–126)
Anion gap: 15 (ref 5–15)
BUN: 29 mg/dL — ABNORMAL HIGH (ref 8–23)
CO2: 20 mmol/L — ABNORMAL LOW (ref 22–32)
Calcium: 9.4 mg/dL (ref 8.9–10.3)
Chloride: 91 mmol/L — ABNORMAL LOW (ref 98–111)
Creatinine, Ser: 2.44 mg/dL — ABNORMAL HIGH (ref 0.44–1.00)
GFR, Estimated: 21 mL/min — ABNORMAL LOW (ref >60)
Glucose, Bld: 135 mg/dL — ABNORMAL HIGH (ref 70–99)
Potassium: 4.2 mmol/L (ref 3.5–5.1)
Sodium: 127 mmol/L — ABNORMAL LOW (ref 135–145)
Total Bilirubin: 1.2 mg/dL (ref 0.0–1.2)
Total Protein: 7.7 g/dL (ref 6.5–8.1)

## 2023-10-24 LAB — LACTIC ACID, PLASMA
Lactic Acid, Venous: 1.1 mmol/L (ref 0.5–1.9)
Lactic Acid, Venous: 0.8 mmol/L (ref 0.5–1.9)

## 2023-10-24 SURGERY — CYSTOSCOPY, WITH STENT INSERTION
Anesthesia: General | Site: Ureter | Laterality: Right

## 2023-10-24 MED ORDER — MIDAZOLAM HCL 2 MG/2ML IJ SOLN
INTRAMUSCULAR | Status: AC
  Filled 2023-10-24: qty 2

## 2023-10-24 MED ORDER — IOHEXOL 300 MG/ML  SOLN
INTRAMUSCULAR | Status: DC | PRN
  Administered 2023-10-24: 8 mL

## 2023-10-24 MED ORDER — SODIUM CHLORIDE 0.9 % IV SOLN
2.0000 g | Freq: Once | INTRAVENOUS | Status: AC
  Administered 2023-10-24: 2 g via INTRAVENOUS
  Filled 2023-10-24: qty 12.5

## 2023-10-24 MED ORDER — LACTATED RINGERS IV BOLUS
1000.0000 mL | Freq: Once | INTRAVENOUS | Status: AC
  Administered 2023-10-24: 1000 mL via INTRAVENOUS

## 2023-10-24 MED ORDER — SODIUM CHLORIDE 0.9 % IR SOLN
Status: DC | PRN
  Administered 2023-10-24: 3000 mL

## 2023-10-24 MED ORDER — FLUCONAZOLE 200 MG PO TABS: 200.0000 mg | ORAL_TABLET | Freq: Once | ORAL | Status: DC

## 2023-10-24 MED ORDER — LACTATED RINGERS IV SOLN: INTRAVENOUS | Status: DC

## 2023-10-24 MED ORDER — PROPOFOL 10 MG/ML IV BOLUS
INTRAVENOUS | Status: DC | PRN
  Administered 2023-10-24: 200 mg via INTRAVENOUS

## 2023-10-24 MED ORDER — METRONIDAZOLE 500 MG/100ML IV SOLN
500.0000 mg | Freq: Once | INTRAVENOUS | Status: AC
  Administered 2023-10-24: 500 mg via INTRAVENOUS
  Filled 2023-10-24: qty 100

## 2023-10-24 MED ORDER — MIDAZOLAM HCL 2 MG/2ML IJ SOLN
INTRAMUSCULAR | Status: DC | PRN
  Administered 2023-10-24: 2 mg via INTRAVENOUS

## 2023-10-24 MED ORDER — DEXAMETHASONE SODIUM PHOSPHATE 10 MG/ML IJ SOLN
INTRAMUSCULAR | Status: DC | PRN
  Administered 2023-10-24: 10 mg via INTRAVENOUS

## 2023-10-24 MED ORDER — ONDANSETRON HCL 4 MG/2ML IJ SOLN: 4.0000 mg | Freq: Four times a day (QID) | INTRAMUSCULAR | Status: DC | PRN

## 2023-10-24 MED ORDER — FENTANYL CITRATE (PF) 100 MCG/2ML IJ SOLN
INTRAMUSCULAR | Status: AC
  Filled 2023-10-24: qty 2

## 2023-10-24 MED ORDER — ACETAMINOPHEN 650 MG RE SUPP: 650.0000 mg | Freq: Four times a day (QID) | RECTAL | Status: DC | PRN

## 2023-10-24 MED ORDER — HEPARIN SODIUM (PORCINE) 5000 UNIT/ML IJ SOLN
5000.0000 [IU] | Freq: Three times a day (TID) | INTRAMUSCULAR | Status: DC
  Administered 2023-10-25 (×3): 5000 [IU] via SUBCUTANEOUS
  Filled 2023-10-24 (×5): qty 1

## 2023-10-24 MED ORDER — FENTANYL CITRATE (PF) 250 MCG/5ML IJ SOLN
INTRAMUSCULAR | Status: DC | PRN
  Administered 2023-10-24: 50 ug via INTRAVENOUS

## 2023-10-24 MED ORDER — LACTATED RINGERS IV SOLN: INTRAVENOUS | Status: AC

## 2023-10-24 MED ORDER — ACETAMINOPHEN 10 MG/ML IV SOLN
INTRAVENOUS | Status: DC | PRN
Start: 2023-10-24 — End: 2023-10-24
  Administered 2023-10-24: 1000 mg via INTRAVENOUS

## 2023-10-24 MED ORDER — OXYCODONE HCL 5 MG PO TABS: 5.0000 mg | ORAL_TABLET | ORAL | Status: DC | PRN

## 2023-10-24 MED ORDER — VANCOMYCIN HCL IN DEXTROSE 1-5 GM/200ML-% IV SOLN
1000.0000 mg | Freq: Once | INTRAVENOUS | Status: AC
  Administered 2023-10-24: 1000 mg via INTRAVENOUS
  Filled 2023-10-24: qty 200

## 2023-10-24 MED ORDER — PHENYLEPHRINE 80 MCG/ML (10ML) SYRINGE FOR IV PUSH (FOR BLOOD PRESSURE SUPPORT)
PREFILLED_SYRINGE | INTRAVENOUS | Status: DC | PRN
  Administered 2023-10-24 (×3): 160 ug via INTRAVENOUS

## 2023-10-24 MED ORDER — ONDANSETRON HCL 4 MG PO TABS: 4.0000 mg | ORAL_TABLET | Freq: Four times a day (QID) | ORAL | Status: DC | PRN

## 2023-10-24 MED ORDER — TRAZODONE HCL 50 MG PO TABS
25.0000 mg | ORAL_TABLET | Freq: Every evening | ORAL | Status: DC | PRN
Start: 2023-10-24 — End: 2023-10-27

## 2023-10-24 MED ORDER — ACETAMINOPHEN 325 MG PO TABS: 650.0000 mg | ORAL_TABLET | Freq: Four times a day (QID) | ORAL | Status: DC | PRN

## 2023-10-24 MED ORDER — SODIUM CHLORIDE 0.9 % IV BOLUS
1000.0000 mL | Freq: Once | INTRAVENOUS | Status: AC
  Administered 2023-10-24: 1000 mL via INTRAVENOUS

## 2023-10-24 MED ORDER — ALBUTEROL SULFATE (2.5 MG/3ML) 0.083% IN NEBU: 2.5000 mg | INHALATION_SOLUTION | RESPIRATORY_TRACT | Status: DC | PRN

## 2023-10-24 MED ORDER — ACETAMINOPHEN 325 MG PO TABS
650.0000 mg | ORAL_TABLET | Freq: Once | ORAL | Status: AC | PRN
  Administered 2023-10-24: 650 mg via ORAL
  Filled 2023-10-24: qty 2

## 2023-10-24 MED ORDER — METRONIDAZOLE 500 MG/100ML IV SOLN
500.0000 mg | Freq: Two times a day (BID) | INTRAVENOUS | Status: DC
  Administered 2023-10-25 – 2023-10-26 (×3): 500 mg via INTRAVENOUS
  Filled 2023-10-24 (×4): qty 100

## 2023-10-24 MED ORDER — ONDANSETRON HCL 4 MG/2ML IJ SOLN
INTRAMUSCULAR | Status: DC | PRN
  Administered 2023-10-24: 4 mg via INTRAVENOUS

## 2023-10-24 MED ORDER — FLUCONAZOLE 150 MG PO TABS
150.0000 mg | ORAL_TABLET | Freq: Once | ORAL | Status: AC
  Administered 2023-10-24: 150 mg via ORAL
  Filled 2023-10-24: qty 1

## 2023-10-24 MED ORDER — SODIUM CHLORIDE 0.9 % IV SOLN
2.0000 g | INTRAVENOUS | Status: DC
  Administered 2023-10-25: 2 g via INTRAVENOUS
  Filled 2023-10-24 (×2): qty 12.5

## 2023-10-24 MED ORDER — ACETAMINOPHEN 10 MG/ML IV SOLN
INTRAVENOUS | Status: AC
  Filled 2023-10-24: qty 100

## 2023-10-24 MED ORDER — LIDOCAINE 2% (20 MG/ML) 5 ML SYRINGE
INTRAMUSCULAR | Status: DC | PRN
  Administered 2023-10-24: 100 mg via INTRAVENOUS

## 2023-10-24 MED ORDER — CHLORHEXIDINE GLUCONATE CLOTH 2 % EX PADS: 6.0000 | MEDICATED_PAD | Freq: Every day | CUTANEOUS | Status: DC

## 2023-10-24 MED ORDER — LACTATED RINGERS IV BOLUS
1000.0000 mL | Freq: Once | INTRAVENOUS | Status: AC
  Administered 2023-10-25: 1000 mL via INTRAVENOUS

## 2023-10-24 SURGICAL SUPPLY — 11 items
BAG URO CATCHER STRL LF (MISCELLANEOUS) ×1 IMPLANT
CATH URETL OPEN END 6FR 70 (CATHETERS) ×1 IMPLANT
CLOTH BEACON ORANGE TIMEOUT ST (SAFETY) ×1 IMPLANT
GLOVE BIO SURGEON STRL SZ 6.5 (GLOVE) ×1 IMPLANT
GOWN STRL REUS W/ TWL LRG LVL3 (GOWN DISPOSABLE) ×1 IMPLANT
GUIDEWIRE STR DUAL SENSOR (WIRE) ×1 IMPLANT
KIT TURNOVER KIT A (KITS) ×1 IMPLANT
MANIFOLD NEPTUNE II (INSTRUMENTS) ×1 IMPLANT
PACK CYSTO (CUSTOM PROCEDURE TRAY) ×1 IMPLANT
STENT URET 6FRX24 CONTOUR (STENTS) IMPLANT
TUBING CONNECTING 10 (TUBING) ×1 IMPLANT

## 2023-10-24 NOTE — Anesthesia Procedure Notes (Signed)
 Procedure Name: LMA Insertion Date/Time: 10/24/2023 10:03 PM  Performed by: Cena Epps, CRNAPre-anesthesia Checklist: Patient identified, Emergency Drugs available, Suction available and Patient being monitored Patient Re-evaluated:Patient Re-evaluated prior to induction Oxygen Delivery Method: Circle System Utilized Preoxygenation: Pre-oxygenation with 100% oxygen Induction Type: IV induction Ventilation: Mask ventilation without difficulty LMA: LMA inserted LMA Size: 4.0 Number of attempts: 1 Airway Equipment and Method: Bite block Placement Confirmation: positive ETCO2 Tube secured with: Tape Dental Injury: Teeth and Oropharynx as per pre-operative assessment

## 2023-10-24 NOTE — ED Provider Notes (Signed)
 [place] EMERGENCY DEPARTMENT AT [facility] Provider Note   CSN: [id number] Arrival date & time: 10/24/23  1547     Patient presents with: Fatigue   Kilah Nykole Matos is a 63 y.o. female.   Patient with history of hypertension, hyperlipidemia presents today with complaints of fatigue. She reports that for the past 4-5 weeks she has felt like she has no energy. 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 She denies any other associated symptoms, no cough, congestion, chest pain, shortness of breath, nausea, vomiting, diarrhea, or abdominal pain.  Denies any urinary send states she feels like she is eating and drinking adequately.  Denies any history of similar symptoms previously.  She was unaware that she had a fever until it was checked today.  She has not taken any medication for symptoms. Denies any hematochezia or melena.   The history is provided by the patient. No language interpreter was used.       Prior to Admission medications   Medication Sig Start Date End Date Taking? Authorizing Provider  Evolocumab  (REPATHA  SURECLICK) 140 MG/ML SOAJ Inject 140 mg into the skin every 14 (fourteen) days. 12/06/22   Raford Riggs, MD  Multiple Vitamin (MULTIVITAMIN) LIQD Take 5 mLs by mouth daily.    [provider]  OVER THE COUNTER MEDICATION Liquid vitamin c Advance immunity support    [provider]  OVER THE COUNTER MEDICATION 5,000 mcg. Liquid vitamin b12    [provider]  OVER THE COUNTER MEDICATION Vitamin d  3 liquid daily    [provider]  spironolactone  (ALDACTONE ) 25 MG tablet TAKE 1 TABLET(25 MG) BY MOUTH DAILY 05/23/23   Raford Riggs, MD    Allergies: Atorvastatin     Review of Systems  Constitutional:  Positive for fatigue and fever.  All other systems reviewed and are  negative.   Updated Vital Signs BP 132/80 (BP Location: Right Arm)   Pulse 93   Temp (!) 103.3 F (39.6 C) (Oral)   Resp 20   SpO2 94%   Physical Exam Vitals and nursing note reviewed.  Constitutional:      General: She is not in acute distress.    Appearance: Normal appearance. She is normal weight. She is not ill-appearing, toxic-appearing or diaphoretic.  HENT:     Head: Normocephalic and atraumatic.  Neck:     Comments: No meningismus Cardiovascular:     Rate and Rhythm: Normal rate and regular rhythm.     Heart sounds: Normal heart sounds.  Pulmonary:     Effort: Pulmonary effort is normal. No respiratory distress.     Breath sounds: Normal breath sounds.  Abdominal:     General: Abdomen is flat.     Palpations: Abdomen is soft.     Tenderness: There is no abdominal tenderness.  Musculoskeletal:        General: Normal range of motion.     Cervical back: Normal range of motion and neck supple.     Right lower leg: No edema.     Left lower leg: No edema.  Skin:    General: Skin is warm and dry.  Neurological:     General: No focal deficit present.     Mental Status: She is alert.  Psychiatric:        Mood and Affect: Mood normal.  Behavior: Behavior normal.     (all labs ordered are listed, but only abnormal results are displayed) Labs Reviewed  COMPREHENSIVE METABOLIC PANEL WITH GFR - Abnormal; Notable for the following components:      Result Value   Sodium 127 (*)    Chloride 91 (*)    CO2 20 (*)    Glucose, Bld 135 (*)    BUN 29 (*)    Creatinine, Ser 2.44 (*)    AST 64 (*)    GFR, Estimated 21 (*)    All other components within normal limits  CBC WITH DIFFERENTIAL/PLATELET - Abnormal; Notable for the following components:   WBC 12.1 (*)    RBC 3.56 (*)    Hemoglobin 9.3 (*)    HCT 27.9 (*)    MCV 78.4 (*)    Neutro Abs 10.1 (*)    All other components within normal limits  URINALYSIS, W/ REFLEX TO CULTURE (INFECTION SUSPECTED) -  Abnormal; Notable for the following components:   APPearance HAZY (*)    Hgb urine dipstick MODERATE (*)    Protein, ur 30 (*)    Leukocytes,Ua MODERATE (*)    Bacteria, UA RARE (*)    All other components within normal limits  RESP PANEL BY RT-PCR (RSV, FLU A&B, COVID)  [id number]  CULTURE, BLOOD (ROUTINE X 2)  CULTURE, BLOOD (ROUTINE X 2)  URINE CULTURE  LACTIC ACID, PLASMA  LACTIC ACID, PLASMA    EKG: None  Radiology: CT CHEST ABDOMEN PELVIS WO CONTRAST Result Date: 10/24/2023 CLINICAL DATA:  Respiratory illness, nondiagnostic xray ( for a while ) Sweating at night. 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 RADIATION DOSE REDUCTION: This exam was performed according to the departmental dose-optimization program which includes automated exposure control, adjustment of the mA and/or kV according to patient size and/or use of iterative reconstruction technique. COMPARISON:  CT abdomen pelvis 08/27/2020 FINDINGS: CT CHEST FINDINGS Cardiovascular: Normal heart size. No significant pericardial effusion. The thoracic aorta is normal in caliber. Mild atherosclerotic plaque of the thoracic aorta. No coronary artery calcifications. Mediastinum/Nodes: No enlarged mediastinal, hilar, or axillary lymph nodes. Thyroid  gland, trachea, and esophagus demonstrate no significant findings. Lungs/Pleura: No focal consolidation. No pulmonary nodule. No pulmonary mass. No pleural effusion. No pneumothorax. Musculoskeletal: No chest wall abnormality. No suspicious lytic or blastic osseous lesions. No acute displaced fracture. CT ABDOMEN PELVIS FINDINGS Hepatobiliary: Chronic several pericentimeter and subcentimeter hypodensities within the bilateral patent lobes-likely simple cyst. No gallstones, gallbladder wall thickening, or  pericholecystic fluid. No biliary dilatation. Pancreas: No focal lesion. Normal pancreatic contour. No surrounding inflammatory changes. No main pancreatic ductal dilatation. Spleen: Normal in size without focal abnormality. Adrenals/Urinary Tract: No adrenal nodule bilaterally. Bilateral nephrolithiasis measuring up to 2 mm bilaterally. 6 mm calcified stone within the distal ureter with associated proximal mild hydroureteronephrosis. Associated urothelial thickening. Asymmetric right perinephric and periureteral fat stranding. No left ureterolithiasis.  No left hydronephrosis. The urinary bladder is unremarkable. Stomach/Bowel: Stomach is within normal limits. No evidence of bowel wall thickening or dilatation. Stool throughout the descending colon and rectosigmoid colon. Appendix appears normal. Vascular/Lymphatic: No abdominal aorta or iliac aneurysm. Mild atherosclerotic plaque of the aorta and its branches. No abdominal, pelvic, or inguinal lymphadenopathy. Reproductive: Atrophic uterus.  Bilateral adnexa are unremarkable. Other: No intraperitoneal free fluid. No intraperitoneal free gas. No  organized fluid collection. Musculoskeletal: No abdominal wall hernia or abnormality. No suspicious lytic or blastic osseous lesions. No acute displaced fracture. IMPRESSION: 1. No acute intrathoracic abnormality. 2. Obstructive 6 mm distal ureterolithiasis with associated urothelial thickening-correlate for superimposed infection with urinalysis. 3. Nonobstructive bilateral nephrolithiasis measuring up to 2 mm. 4.  Aortic Atherosclerosis (ICD10-I70.0). Electronically Signed   By: Morgane  Naveau M.D.   On: 10/24/2023 18:24   DG Chest 2 View Result Date: 10/24/2023 CLINICAL DATA:  Fatigue, diaphoresis. EXAM: CHEST - 2 VIEW COMPARISON:  09/30/2012. FINDINGS: Trachea is midline. Heart size normal. Mild streaky bibasilar scarring or atelectasis. There may be trace bilateral pleural effusions. IMPRESSION: Streaky bibasilar  scarring or atelectasis with possible trace bilateral pleural effusions. Electronically Signed   By: Newell Eke M.D.   On: 10/24/2023 16:41     .Critical Care  Performed by: Nora Lauraine LABOR, PA-C Authorized by: Molly Maselli A, PA-C   Critical care provider statement:    Critical care time (minutes):  35   Critical care was necessary to treat or prevent imminent or life-threatening deterioration of the following conditions:  Sepsis   Critical care was time spent personally by me on the following activities:  Development of treatment plan with patient or surrogate, discussions with consultants, discussions with primary provider, evaluation of patient's response to treatment, examination of patient, obtaining history from patient or surrogate, ordering and review of laboratory studies, ordering and review of radiographic studies, pulse oximetry, re-evaluation of patient's condition and review of old charts   Care discussed with: admitting provider      Medications Ordered in the ED  sodium chloride  0.9 % bolus 1,000 mL (has no administration in time range)  lactated ringers  infusion (has no administration in time range)  ceFEPIme  (MAXIPIME ) 2 g in sodium chloride  0.9 % 100 mL IVPB (has no administration in time range)  metroNIDAZOLE  (FLAGYL ) IVPB 500 mg (has no administration in time range)  vancomycin  (VANCOCIN ) IVPB 1000 mg/200 mL premix (has no administration in time range)  acetaminophen  (TYLENOL ) tablet 650 mg (650 mg Oral Given 10/24/23 1613)                                    Medical Decision Making Amount and/or Complexity of Data Reviewed Labs: ordered. Radiology: ordered.  Risk OTC drugs. Prescription drug management.   This patient is a 63 y.o. female who presents to the ED for concern of fatigue, this involves an extensive number of treatment options, and is a complaint that carries with it a high risk of complications and morbidity. The emergent differential  diagnosis prior to evaluation includes, but is not limited to,  sepsis, AKI, URI, UTI, malignancy, pneumonia . This is not an exhaustive differential.   Past Medical History / Co-morbidities / Social History:  has a past medical history of COVID (05/2020), Essential hypertension, benign, Hematuria (08/27/2020), Hot flashes (10/07/2020), Hyperlipidemia, Pneumonia, Right renal stone (10/07/2020), Wears dentures (10/07/2020), and Wears glasses (10/07/2020).  Additional history: Chart reviewed.  Physical Exam: Physical exam performed. The pertinent findings include: overall well appearing, no acute physical exam abnormalities  Lab Tests: I ordered, and personally interpreted labs.  The pertinent results include:  WBC 12.1, hgb 9.3 (10.8 3 weeks ago), Na 127, Chloride 91, bicarb 20, glucose 135, BUN 29, Creatinine 2.44 (up from 15 and 1.20 3 weeks ago). UA infectious   Imaging Studies: I ordered imaging studies including  CXR, CT chest abdomen pelvis. I independently visualized and interpreted imaging which showed   CXR: Streaky bibasilar scarring or atelectasis with possible trace bilateral pleural effusions.  CT:   1. No acute intrathoracic abnormality. 2. Obstructive 6 mm distal ureterolithiasis with associated urothelial thickening-correlate for superimposed infection with urinalysis. 3. Nonobstructive bilateral nephrolithiasis measuring up to 2 mm. 4.  Aortic Atherosclerosis   I agree with the radiologist interpretation.   Medications: I ordered medication including tylenol  for fever, vancomycin , Flagyl , cefepime  for sepsis of unknown origin.  Fluids for sepsis.  Diflucan  for UA with yeast. Reevaluation of the patient after these medicines showed that the patient improved. I have reviewed the patients home medicines and have made adjustments as needed.  Consultations Obtained: I requested consultation with the urology on-call Dr. Elisabeth,  and discussed lab and imaging findings as well as  pertinent plan - they recommend: She requests ED to ED transfer to [facility] ED for surgical management of septic ureterolithiasis. Also requests diflucan  given yeast in the patients UA  Disposition: After consideration of the diagnostic results and the patients response to treatment, I feel that patient will require admission for sepsis, likely due to infected and obstructed right kidney stone.  Discussed with patient is understanding and in agreement with this.  Discussed patient with EDP at [facility] emergency department Dr. Patsey who accepts patient for transfer  I discussed this case with my attending physician Dr. Bari who cosigned this note including patient's presenting symptoms, physical exam, and planned diagnostics and interventions. Attending physician stated agreement with plan or made changes to plan which were implemented.    Final diagnoses:  AKI (acute kidney injury) (HCC)  Urinary tract infection with hematuria, site unspecified  Kidney stone  Sepsis with acute renal failure without septic shock, due to unspecified organism, unspecified acute renal failure type [facility])    ED Discharge Orders     None          Nora Lauraine LABOR, PA-C 10/24/23 2216    Horton, Roxie HERO, DO 10/25/23 0032

## 2023-10-24 NOTE — ED Triage Notes (Signed)
 Fatigue ( for a while ) Sweating at night. On and off for last 2 weeks  Works outside,

## 2023-10-24 NOTE — Sepsis Progress Note (Signed)
 eLink is following this Code Sepsis.

## 2023-10-24 NOTE — H&P (Signed)
 History and Physical  Victoria Strickland FMW:[id number] DOB: November 15, 1960 DOA: 10/24/2023  PCP: Jarold Medici, MD   Chief Complaint: Severe fatigue  HPI: Victoria Strickland is a 64 y.o. female with medical history significant for hypertension, hyperlipidemia and prior nephrolithiasis being admitted to the hospital with severe sepsis.  Patient states that she has had ongoing severe fatigue on and off for the last couple weeks, as well as night sweats.  She denies any cough, nausea, vomiting, flank pain, dysuria, urgency frequency of urination, or changes in bowel habits.  She presented to [facility] this afternoon with these complaints.  Workup revealed fever, tachycardia, leukocytosis, and severe hypotension.  She was treated with IV fluids, given broad-spectrum empiric IV antibiotics.  CT of the chest abdomen pelvis was unremarkable except for obstructing right-sided 6 mm renal stone.  Patient has been transferred ER to ER, and is about to undergo urgent right-sided ureteroscopy and stent placement.  Review of Systems: Please see HPI for pertinent positives and negatives. A complete 10 system review of systems are otherwise negative.  Past Medical History:  Diagnosis Date   COVID 05/2020   mild symptoms x few days all symptoms resolved   Essential hypertension, benign    Hematuria 08/27/2020   pt in er, issue resolved as of 10-07-2020   Hot flashes 10/07/2020   Hyperlipidemia    Pneumonia    yrs ago per pt on 10-07-2020   Right renal stone 10/07/2020   Wears dentures 10/07/2020   upper   Wears glasses 10/07/2020   Past Surgical History:  Procedure Laterality Date   CYSTOSCOPY WITH RETROGRADE PYELOGRAM, URETEROSCOPY AND STENT PLACEMENT Right 10/12/2020   Procedure: CYSTOSCOPY WITH RETROGRADE PYELOGRAM, URETEROSCOPY AND STENT PLACEMENT AND LASER LITHOTRIPSY;  Surgeon: Elisabeth Valli BIRCH, MD;  Location: [facility] [place];  Service: Urology;  Laterality: Right;  1 HR   TONSILLECTOMY   1973   Social History:  reports that she has never smoked. She has never used smokeless tobacco. She reports that she does not drink alcohol and does not use drugs.  Allergies  Allergen Reactions   Atorvastatin      Memory loss    Family History  Problem Relation Age of Onset   Fibroids Mother    Cancer Mother        unknown   Aneurysm Father    Heart disease Maternal Grandmother    Breast cancer Cousin        maternal side   Cancer Other        maternal   Breast cancer Other    Stroke Other      Prior to Admission medications   Medication Sig Start Date End Date Taking? Authorizing Provider  Evolocumab  (REPATHA  SURECLICK) 140 MG/ML SOAJ Inject 140 mg into the skin every 14 (fourteen) days. 12/06/22   Raford Riggs, MD  Multiple Vitamin (MULTIVITAMIN) LIQD Take 5 mLs by mouth daily.    [provider]  OVER THE COUNTER MEDICATION Liquid vitamin c Advance immunity support    [provider]  OVER THE COUNTER MEDICATION 5,000 mcg. Liquid vitamin b12    [provider]  OVER THE COUNTER MEDICATION Vitamin d  3 liquid daily    [provider]  spironolactone  (ALDACTONE ) 25 MG tablet TAKE 1 TABLET(25 MG) BY MOUTH DAILY 05/23/23   Raford Riggs, MD    Physical Exam: BP 119/68   Pulse 68   Temp 99.2 F (37.3 C) (Oral)   Resp 18   SpO2  100%  General:  Alert, oriented, calm, in no acute distress  Cardiovascular: RRR, no murmurs or rubs, no peripheral edema  Respiratory: clear to auscultation bilaterally, no wheezes, no crackles  Abdomen: soft, nontender, nondistended, normal bowel tones heard  Skin: dry, no rashes  Musculoskeletal: no joint effusions, normal range of motion  Psychiatric: appropriate affect, normal speech  Neurologic: extraocular muscles intact, clear speech, moving all extremities with intact sensorium         Labs on Admission:  Basic Metabolic Panel: Recent Labs  Lab 10/24/23 1624  NA 127*  K 4.2  CL 91*   CO2 20*  GLUCOSE 135*  BUN 29*  CREATININE 2.44*  CALCIUM  9.4   Liver Function Tests: Recent Labs  Lab 10/24/23 1624  AST 64*  ALT 38  ALKPHOS 56  BILITOT 1.2  PROT 7.7  ALBUMIN 3.9   No results for input(s): LIPASE, AMYLASE in the last 168 hours. No results for input(s): AMMONIA in the last 168 hours. CBC: Recent Labs  Lab 10/24/23 1624  WBC 12.1*  NEUTROABS 10.1*  HGB 9.3*  HCT 27.9*  MCV 78.4*  PLT 154   Cardiac Enzymes: No results for input(s): CKTOTAL, CKMB, CKMBINDEX, TROPONINI in the last 168 hours. BNP (last 3 results) No results for input(s): BNP in the last 8760 hours.  ProBNP (last 3 results) No results for input(s): PROBNP in the last 8760 hours.  CBG: No results for input(s): GLUCAP in the last 168 hours.  Radiological Exams on Admission: CT CHEST ABDOMEN PELVIS WO CONTRAST Result Date: 10/24/2023 CLINICAL DATA:  Respiratory illness, nondiagnostic xray ( for a while ) Sweating at night. 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 RADIATION DOSE REDUCTION: This exam was performed according to the departmental dose-optimization program which includes automated exposure control, adjustment of the mA and/or kV according to patient size and/or use of iterative reconstruction technique. COMPARISON:  CT abdomen pelvis 08/27/2020 FINDINGS: CT CHEST FINDINGS Cardiovascular: Normal heart size. No significant pericardial effusion. The thoracic aorta is normal in caliber. Mild atherosclerotic plaque of the thoracic aorta. No coronary artery calcifications. Mediastinum/Nodes: No enlarged mediastinal, hilar, or axillary lymph nodes. Thyroid  gland, trachea, and esophagus demonstrate no significant findings. Lungs/Pleura: No focal consolidation. No pulmonary nodule. No  pulmonary mass. No pleural effusion. No pneumothorax. Musculoskeletal: No chest wall abnormality. No suspicious lytic or blastic osseous lesions. No acute displaced fracture. CT ABDOMEN PELVIS FINDINGS Hepatobiliary: Chronic several pericentimeter and subcentimeter hypodensities within the bilateral patent lobes-likely simple cyst. No gallstones, gallbladder wall thickening, or pericholecystic fluid. No biliary dilatation. Pancreas: No focal lesion. Normal pancreatic contour. No surrounding inflammatory changes. No main pancreatic ductal dilatation. Spleen: Normal in size without focal abnormality. Adrenals/Urinary Tract: No adrenal nodule bilaterally. Bilateral nephrolithiasis measuring up to 2 mm bilaterally. 6 mm calcified stone within the distal ureter with associated proximal mild hydroureteronephrosis. Associated urothelial thickening. Asymmetric right perinephric and periureteral fat stranding. No left ureterolithiasis.  No left hydronephrosis. The urinary bladder is unremarkable. Stomach/Bowel: Stomach is within normal limits. No evidence of bowel wall thickening or dilatation. Stool throughout the descending colon and rectosigmoid colon. Appendix appears normal. Vascular/Lymphatic: No abdominal aorta or iliac aneurysm. Mild atherosclerotic plaque of the aorta and its branches. No abdominal, pelvic, or inguinal lymphadenopathy. Reproductive: Atrophic uterus.  Bilateral adnexa are unremarkable. Other: No intraperitoneal free fluid.  No intraperitoneal free gas. No organized fluid collection. Musculoskeletal: No abdominal wall hernia or abnormality. No suspicious lytic or blastic osseous lesions. No acute displaced fracture. IMPRESSION: 1. No acute intrathoracic abnormality. 2. Obstructive 6 mm distal ureterolithiasis with associated urothelial thickening-correlate for superimposed infection with urinalysis. 3. Nonobstructive bilateral nephrolithiasis measuring up to 2 mm. 4.  Aortic Atherosclerosis  (ICD10-I70.0). Electronically Signed   By: Morgane  Naveau M.D.   On: 10/24/2023 18:24   DG Chest 2 View Result Date: 10/24/2023 CLINICAL DATA:  Fatigue, diaphoresis. EXAM: CHEST - 2 VIEW COMPARISON:  09/30/2012. FINDINGS: Trachea is midline. Heart size normal. Mild streaky bibasilar scarring or atelectasis. There may be trace bilateral pleural effusions. IMPRESSION: Streaky bibasilar scarring or atelectasis with possible trace bilateral pleural effusions. Electronically Signed   By: Newell Eke M.D.   On: 10/24/2023 16:41   Assessment/Plan Victoria Strickland is a 63 y.o. female with medical history significant for hypertension, hyperlipidemia and prior nephrolithiasis being admitted to the hospital with severe sepsis.  Severe sepsis-meeting criteria with tachycardia, fever, leukocytosis, endorgan dysfunction with acute kidney injury.  Lactic acid is normal, hypotension has resolved.  Source of her severe sepsis is unclear at this time, though she has nephrolithiasis, she does not have convincing evidence of acute bacterial infection. -Inpatient admission -Monitor closely on stepdown -Continue IV fluids -Will continue broad-spectrum empiric IV antibiotics with IV cefepime  and IV Flagyl  -Follow-up blood and urine cultures -Check viral respiratory panel  6 mm right-sided obstructing renal stone-discussed with Dr. Elisabeth of urology, patient will undergo urgent ureteroscopy and stent placement tonight  Essential hypertension-hold home antihypertensives in the setting of severe sepsis and related hypotension  Hyponatremia-this is likely a hypovolemic hyponatremia in the setting of her sepsis -Will hydrate aggressively with LR, and monitor BMP  Acute kidney injury-likely from ATN related to her sepsis, hypotension and acute infection. -Avoid nephrotoxins, hydrate aggressively with LR  DVT prophylaxis: Subcutaneous heparin     Code Status: Full Code  Consults called: Urology  Admission  status: The appropriate patient status for this patient is INPATIENT. Inpatient status is judged to be reasonable and necessary in order to provide the required intensity of service to ensure the patient's safety. The patient's presenting symptoms, physical exam findings, and initial radiographic and laboratory data in the context of their chronic comorbidities is felt to place them at high risk for further clinical deterioration. Furthermore, it is not anticipated that the patient will be medically stable for discharge from the hospital within 2 midnights of admission.    I certify that at the point of admission it is my clinical judgment that the patient will require inpatient hospital care spanning beyond 2 midnights from the point of admission due to high intensity of service, high risk for further deterioration and high frequency of surveillance required  Time spent: 59 minutes  Damel Querry CHRISTELLA Gail MD [facility] Pager [phone]  If 7PM-7AM, please contact night-coverage [email or web address] Password [email or web address]  10/24/2023, 9:47 PM

## 2023-10-24 NOTE — Transfer of Care (Signed)
 Immediate Anesthesia Transfer of Care Note  Patient: Victoria Strickland  Procedure(s) Performed: CYSTOSCOPY, WITH STENT INSERTION (Right: Ureter)  Patient Location: PACU  Anesthesia Type:General  Level of Consciousness: sedated  Airway & Oxygen Therapy: Patient Spontanous Breathing and Patient connected to face mask oxygen  Post-op Assessment: Report given to RN and BP low due to septic origin. Giving fluid bolus, will keep monitoring  Post vital signs: Reviewed  Last Vitals:  Vitals Value Taken Time  BP 80/44 10/24/23 22:34  Temp    Pulse 77 10/24/23 22:37  Resp 20 10/24/23 22:37  SpO2 99 % 10/24/23 22:37  Vitals shown include unfiled device data.  Last Pain:  Vitals:   10/24/23 1936  TempSrc:   PainSc: 0-No pain         Complications: No notable events documented.

## 2023-10-24 NOTE — ED Notes (Signed)
 Patient transported to CT

## 2023-10-24 NOTE — ED Notes (Signed)
 [facility] at bedside

## 2023-10-24 NOTE — Anesthesia Preprocedure Evaluation (Addendum)
 Anesthesia Evaluation  Patient identified by MRN, date of birth, ID band Patient awake    Reviewed: Allergy & Precautions, NPO status , Patient's Chart, lab work & pertinent test results  Airway Mallampati: I  TM Distance: >3 FB Neck ROM: Full    Dental  (+) Upper Dentures, Edentulous Upper, Dental Advisory Given   Pulmonary neg pulmonary ROS   Pulmonary exam normal        Cardiovascular hypertension, Pt. on medications  Rhythm:Regular Rate:Normal     Neuro/Psych negative neurological ROS  negative psych ROS   GI/Hepatic negative GI ROS, Neg liver ROS,,,  Endo/Other  negative endocrine ROS    Renal/GU Renal diseaseRenal calculus  Bladder dysfunction      Musculoskeletal negative musculoskeletal ROS (+)    Abdominal  (+)  Abdomen: soft.   Peds  Hematology negative hematology ROS (+)   Anesthesia Other Findings   Reproductive/Obstetrics                              Anesthesia Physical Anesthesia Plan  ASA: 2 and emergent  Anesthesia Plan: General   Post-op Pain Management: Ofirmev  IV (intra-op)* and Toradol  IV (intra-op)*   Induction: Intravenous  PONV Risk Score and Plan: 2 and Ondansetron , Dexamethasone , Midazolam  and Treatment may vary due to age or medical condition  Airway Management Planned: Oral ETT  Additional Equipment: None  Intra-op Plan:   Post-operative Plan: Extubation in OR  Informed Consent: I have reviewed the patients History and Physical, chart, labs and discussed the procedure including the risks, benefits and alternatives for the proposed anesthesia with the patient or authorized representative who has indicated his/her understanding and acceptance.     Dental advisory given  Plan Discussed with: CRNA and Anesthesiologist  Anesthesia Plan Comments: (   )         Anesthesia Quick Evaluation

## 2023-10-24 NOTE — Op Note (Signed)
 Preoperative diagnosis:  Right ureteral calculus    Postoperative diagnosis:  Right ureteral calculus   Procedure:  Cystoscopy right ureteral stent placement right retrograde pyelography with interpretation   Surgeon: Valli Shank, MD  Anesthesia: General  Complications: None  Intraoperative findings:   right retrograde pyelography demonstrated a filling defect within the right ureter consistent with the patient's known calculus without other abnormalities and proximal moderate hydronephrosis Normal urethra Bilateral orthotopic Uos Normal bladder mucosa  EBL: Minimal  Specimens: None  Indication: Victoria Strickland is a 63 y.o. patient with 6mm distal ureteral calculus, AKI and sepsis criteria. After reviewing the management options for treatment, he elected to proceed with the above surgical procedure(s). We have discussed the potential benefits and risks of the procedure, side effects of the proposed treatment, the likelihood of the patient achieving the goals of the procedure, and any potential problems that might occur during the procedure or recuperation. Informed consent has been obtained.  Description of procedure:  The patient was taken to the operating room and general anesthesia was induced.  The patient was placed in the dorsal lithotomy position, prepped and draped in the usual sterile fashion, and preoperative antibiotics were administered. A preoperative time-out was performed.   Cystourethroscopy was performed.  The patient's urethra was examined and was normal. The bladder was then systematically examined in its entirety. There was no evidence for any bladder tumors, stones, or other mucosal pathology.    Attention then turned to the rightureteral orifice and a ureteral catheter was used to intubate the ureteral orifice.  Omnipaque  contrast was injected through the ureteral catheter and a retrograde pyelogram was performed with findings as dictated above.  A  0.38 sensor guidewire was then advanced up the right ureter into the renal pelvis under fluoroscopic guidance.  The wire was then backloaded through the cystoscope and a ureteral stent was advance over the wire using Seldinger technique.  The stent was positioned appropriately under fluoroscopic and cystoscopic guidance.  The wire was then removed with an adequate stent curl noted in the renal pelvis as well as in the bladder.  The bladder was then emptied and the procedure ended.  The patient appeared to tolerate the procedure well and without complications.  The patient was able to be awakened and transferred to the recovery unit in satisfactory condition.    Valli Shank, M.D.

## 2023-10-24 NOTE — ED Notes (Signed)
 Blood cultures x2 drawn before starting antibiotics.

## 2023-10-24 NOTE — ED Notes (Signed)
 [facility] called.

## 2023-10-24 NOTE — Consult Note (Signed)
 I have been asked to see the patient by Dr. Roxie Bough for evaluation and management of right ureteral calculus with UTI and sepsis criteria.  History of present illness:63 yo woman with history of urolithaisis presented to the [facility] with fatigue and sepsis criteria.  CT of the abdomen pelvis was performed which showed 6 mm obstructing right distal ureteral calculus.  Urinalysis had 21-50 RBCs with rare bacteria.  Patient was hypotensive and there was concern for sepsis from urinary source.  She was then transferred to [facility] for urgent right ureteral stent placement.  She has previously undergone right sided ureteroscopy with laser lithotripsy in 2022 (Dr. Elisabeth).    Review of systems: A 12 point comprehensive review of systems was obtained and is negative unless otherwise stated in the history of present illness.  Patient Active Problem List   Diagnosis Date Noted   Unintentional weight loss 10/06/2023   Routine general medical examination at health care facility 10/02/2023   Pure hypercholesterolemia 10/02/2023   Essential hypertension, benign 07/22/2018    No current facility-administered medications on file prior to encounter.   Current Outpatient Medications on File Prior to Encounter  Medication Sig Dispense Refill   Evolocumab  (REPATHA  SURECLICK) 140 MG/ML SOAJ Inject 140 mg into the skin every 14 (fourteen) days. 6 mL 3   Multiple Vitamin (MULTIVITAMIN) LIQD Take 5 mLs by mouth daily.     OVER THE COUNTER MEDICATION Liquid vitamin c Advance immunity support     OVER THE COUNTER MEDICATION 5,000 mcg. Liquid vitamin b12     OVER THE COUNTER MEDICATION Vitamin d  3 liquid daily     spironolactone  (ALDACTONE ) 25 MG tablet TAKE 1 TABLET(25 MG) BY MOUTH DAILY 90 tablet 1    Past Medical History:  Diagnosis Date   COVID 05/2020   mild symptoms x few days all symptoms resolved   Essential hypertension, benign    Hematuria 08/27/2020   pt in er, issue resolved  as of 10-07-2020   Hot flashes 10/07/2020   Hyperlipidemia    Pneumonia    yrs ago per pt on 10-07-2020   Right renal stone 10/07/2020   Wears dentures 10/07/2020   upper   Wears glasses 10/07/2020    Past Surgical History:  Procedure Laterality Date   CYSTOSCOPY WITH RETROGRADE PYELOGRAM, URETEROSCOPY AND STENT PLACEMENT Right 10/12/2020   Procedure: CYSTOSCOPY WITH RETROGRADE PYELOGRAM, URETEROSCOPY AND STENT PLACEMENT AND LASER LITHOTRIPSY;  Surgeon: Elisabeth Valli BIRCH, MD;  Location: [facility];  Service: Urology;  Laterality: Right;  1 HR   TONSILLECTOMY  1973    Social History   Tobacco Use   Smoking status: Never   Smokeless tobacco: Never  Vaping Use   Vaping status: Never Used  Substance Use Topics   Alcohol use: Never   Drug use: Never    Family History  Problem Relation Age of Onset   Fibroids Mother    Cancer Mother        unknown   Aneurysm Father    Heart disease Maternal Grandmother    Breast cancer Cousin        maternal side   Cancer Other        maternal   Breast cancer Other    Stroke Other     PE: Vitals:   10/24/23 1730 10/24/23 1826 10/24/23 1845 10/24/23 1900  BP: (!) 98/59 (!) 96/53 110/65 119/68  Pulse: 79 71 68 68  Resp: 18 18  18  Temp: (!) 100.4 F (38 C) 99.2 F (37.3 C)    TempSrc: Oral Oral    SpO2: 97% 98% 99% 100%   Patient appears to be in no acute distress  patient is alert and oriented x3 Atraumatic normocephalic head No cervical or supraclavicular lymphadenopathy appreciated No increased work of breathing, no audible wheezes/rhonchi Regular sinus rhythm/rate Abdomen is soft, nontender, nondistended, no CVA or suprapubic tenderness Lower extremities are symmetric without appreciable edema Grossly neurologically intact No identifiable skin lesions  Recent Labs    10/24/23 1624  WBC 12.1*  HGB 9.3*  HCT 27.9*   Recent Labs    10/24/23 1624  NA 127*  K 4.2  CL 91*  CO2 20*  GLUCOSE 135*  BUN  29*  CREATININE 2.44*  CALCIUM  9.4   No results for input(s): LABPT, INR in the last 72 hours. No results for input(s): LABURIN in the last 72 hours. Results for orders placed or performed during the hospital encounter of 10/24/23  Resp panel by RT-PCR (RSV, Flu A&B, Covid) Anterior Nasal Swab     Status: None   Collection Time: 10/24/23  4:24 PM   Specimen: Anterior Nasal Swab  Result Value Ref Range Status   SARS Coronavirus 2 by RT PCR NEGATIVE NEGATIVE Final    Comment: (NOTE) SARS-CoV-2 target nucleic acids are NOT DETECTED.  The SARS-CoV-2 RNA is generally detectable in upper respiratory specimens during the acute phase of infection. The lowest concentration of SARS-CoV-2 viral copies this assay can detect is 138 copies/mL. A negative result does not preclude SARS-Cov-2 infection and should not be used as the sole basis for treatment or other patient management decisions. A negative result may occur with  improper specimen collection/handling, submission of specimen other than nasopharyngeal swab, presence of viral mutation(s) within the areas targeted by this assay, and inadequate number of viral copies(<138 copies/mL). A negative result must be combined with clinical observations, patient history, and epidemiological information. The expected result is Negative.  Fact Sheet for Patients:  BloggerCourse.com  Fact Sheet for Healthcare Providers:  SeriousBroker.it  This test is no t yet approved or cleared by the [place]  FDA and  has been authorized for detection and/or diagnosis of SARS-CoV-2 by FDA under an Emergency Use Authorization (EUA). This EUA will remain  in effect (meaning this test can be used) for the duration of the COVID-19 declaration under Section 564(b)(1) of the Act, 21 U.S.C.section 360bbb-3(b)(1), unless the authorization is terminated  or revoked sooner.       Influenza A by PCR  NEGATIVE NEGATIVE Final   Influenza B by PCR NEGATIVE NEGATIVE Final    Comment: (NOTE) The Xpert Xpress SARS-CoV-2/FLU/RSV plus assay is intended as an aid in the diagnosis of influenza from Nasopharyngeal swab specimens and should not be used as a sole basis for treatment. Nasal washings and aspirates are unacceptable for Xpert Xpress SARS-CoV-2/FLU/RSV testing.  Fact Sheet for Patients: BloggerCourse.com  Fact Sheet for Healthcare Providers: SeriousBroker.it  This test is not yet approved or cleared by the [place]  FDA and has been authorized for detection and/or diagnosis of SARS-CoV-2 by FDA under an Emergency Use Authorization (EUA). This EUA will remain in effect (meaning this test can be used) for the duration of the COVID-19 declaration under Section 564(b)(1) of the Act, 21 U.S.C. section 360bbb-3(b)(1), unless the authorization is terminated or revoked.     Resp Syncytial Virus by PCR NEGATIVE NEGATIVE Final    Comment: (NOTE) Fact Sheet for Patients:  BloggerCourse.com  Fact Sheet for Healthcare Providers: SeriousBroker.it  This test is not yet approved or cleared by the [place]  FDA and has been authorized for detection and/or diagnosis of SARS-CoV-2 by FDA under an Emergency Use Authorization (EUA). This EUA will remain in effect (meaning this test can be used) for the duration of the COVID-19 declaration under Section 564(b)(1) of the Act, 21 U.S.C. section 360bbb-3(b)(1), unless the authorization is terminated or revoked.  Performed at [facility], [address], [place], [place] [address]     Imaging: CT Abd/Pelvis IMPRESSION: 1. No acute intrathoracic abnormality. 2. Obstructive 6 mm distal ureterolithiasis with associated urothelial thickening-correlate for superimposed infection with urinalysis. 3. Nonobstructive  bilateral nephrolithiasis measuring up to 2 mm. 4.  Aortic Atherosclerosis (ICD10-I70.0).     Electronically Signed   By: Morgane  Naveau M.D.   On: 10/24/2023 18:24  A/P:  1.  Right ureteral calculus with sepsis criteria: - We discussed urgent right ureteral stent placement due to sepsis criteria in setting of obstructing ureteral calculus -Risks and benefits include pain, bleeding, infection, need for staged procedure, need for additional treatment, stent discomfort, ureteral stricture - Patient understands this will be a staged procedure and will be admitted to medicine afterwards   Please page with any further questions or concerns. Chi Garlow D Aziza Stuckert

## 2023-10-25 ENCOUNTER — Encounter (HOSPITAL_COMMUNITY): Payer: Self-pay | Admitting: Urology

## 2023-10-25 DIAGNOSIS — A419 Sepsis, unspecified organism: Secondary | ICD-10-CM | POA: Diagnosis not present

## 2023-10-25 DIAGNOSIS — N179 Acute kidney failure, unspecified: Principal | ICD-10-CM

## 2023-10-25 DIAGNOSIS — N200 Calculus of kidney: Secondary | ICD-10-CM

## 2023-10-25 DIAGNOSIS — R319 Hematuria, unspecified: Secondary | ICD-10-CM

## 2023-10-25 DIAGNOSIS — N390 Urinary tract infection, site not specified: Secondary | ICD-10-CM | POA: Diagnosis not present

## 2023-10-25 LAB — RESPIRATORY PANEL BY PCR

## 2023-10-25 LAB — CBC
HCT: 23.8 % — ABNORMAL LOW (ref 36.0–46.0)
HCT: 29.5 % — ABNORMAL LOW (ref 36.0–46.0)
Hemoglobin: 7.3 g/dL — ABNORMAL LOW (ref 12.0–15.0)
Hemoglobin: 9.1 g/dL — ABNORMAL LOW (ref 12.0–15.0)
MCH: 25.1 pg — ABNORMAL LOW (ref 26.0–34.0)
MCH: 26.1 pg (ref 26.0–34.0)
MCHC: 30.7 g/dL (ref 30.0–36.0)
MCHC: 30.8 g/dL (ref 30.0–36.0)
MCV: 81.8 fL (ref 80.0–100.0)
MCV: 84.5 fL (ref 80.0–100.0)
Platelets: 104 10*3/uL — ABNORMAL LOW (ref 150–400)
Platelets: 107 K/uL — ABNORMAL LOW (ref 150–400)
RBC: 2.91 MIL/uL — ABNORMAL LOW (ref 3.87–5.11)
RBC: 3.49 MIL/uL — ABNORMAL LOW (ref 3.87–5.11)
RDW: 13.9 % (ref 11.5–15.5)
RDW: 14.5 % (ref 11.5–15.5)
WBC: 7.7 10*3/uL (ref 4.0–10.5)
WBC: 6 K/uL (ref 4.0–10.5)
nRBC: 0 % (ref 0.0–0.2)
nRBC: 0 % (ref 0.0–0.2)

## 2023-10-25 LAB — BASIC METABOLIC PANEL WITH GFR
Anion gap: 9 (ref 5–15)
BUN: 23 mg/dL (ref 8–23)
CO2: 20 mmol/L — ABNORMAL LOW (ref 22–32)
Calcium: 8.3 mg/dL — ABNORMAL LOW (ref 8.9–10.3)
Chloride: 103 mmol/L (ref 98–111)
Creatinine, Ser: 1.88 mg/dL — ABNORMAL HIGH (ref 0.44–1.00)
GFR, Estimated: 29 mL/min — ABNORMAL LOW (ref >60)
Glucose, Bld: 152 mg/dL — ABNORMAL HIGH (ref 70–99)
Potassium: 4.3 mmol/L (ref 3.5–5.1)
Sodium: 132 mmol/L — ABNORMAL LOW (ref 135–145)

## 2023-10-25 LAB — HIV ANTIBODY (ROUTINE TESTING W REFLEX): HIV Screen 4th Generation wRfx: NONREACTIVE

## 2023-10-25 LAB — LACTIC ACID, PLASMA
Lactic Acid, Venous: 1 mmol/L (ref 0.5–1.9)
Lactic Acid, Venous: 1.6 mmol/L (ref 0.5–1.9)

## 2023-10-25 LAB — MRSA NEXT GEN BY PCR, NASAL: MRSA by PCR Next Gen: NOT DETECTED

## 2023-10-25 MED ORDER — MIDODRINE HCL 5 MG PO TABS
5.0000 mg | ORAL_TABLET | ORAL | Status: AC
  Administered 2023-10-25: 5 mg via ORAL

## 2023-10-25 MED ORDER — MIDODRINE HCL 5 MG PO TABS
10.0000 mg | ORAL_TABLET | ORAL | Status: DC
  Filled 2023-10-25: qty 2

## 2023-10-25 MED ORDER — LACTATED RINGERS IV BOLUS
500.0000 mL | Freq: Once | INTRAVENOUS | Status: AC
  Administered 2023-10-25: 500 mL via INTRAVENOUS

## 2023-10-25 MED ORDER — CHLORHEXIDINE GLUCONATE CLOTH 2 % EX PADS: 6.0000 | MEDICATED_PAD | Freq: Every day | CUTANEOUS | Status: DC

## 2023-10-25 MED ORDER — LACTATED RINGERS IV SOLN: INTRAVENOUS | Status: AC

## 2023-10-25 NOTE — Progress Notes (Signed)
 This RN attempted to call pt's husband several times once pt was settled in the room but phone went to voicemail.

## 2023-10-25 NOTE — Progress Notes (Signed)
 1 Day Post-Op Subjective: First time meeting Ms. Buccellato and her husband this morning.  We reviewed the case and plan including diagrams and illustrations of urinary system and ureteral stent.  All questions were answered to their satisfaction.  Objective: Vital signs in last 24 hours: Temp:  [95.3 F (35.2 C)-103.3 F (39.6 C)] 98 F (36.7 C) (07/24 1200) Pulse Rate:  [51-93] 57 (07/24 1300) Resp:  [13-24] 16 (07/24 1300) BP: (79-135)/(42-80) 127/72 (07/24 1300) SpO2:  [94 %-100 %] 98 % (07/24 1300) Weight:  [68.9 kg-71.6 kg] 71.6 kg (07/23 2333)  Assessment/Plan: 63 year old female presenting with severe fatigue x 2 weeks as well as night sweats.  6 mm distal right ureteral stone found.  S/p stent placement with pace on 7/23  # Right ureteral calculus #AKI #UTI S/p right ureteral stent placement with Dr. Elisabeth on 7/23 Relatively unremarkable urinalysis.  Rare bacteria noted.  Awaiting culture data.  Labile BP this morning but hemodynamically stable and improving over the course of the day.  Normothermic, mild leukocytosis, lactic acid within normal limits.  Does not appear to be septic at this time, hopefully she will discharge to the floor in the near future. Trend labs, interval improvement in serum creatinine. Definitive stone management on an outpatient basis. Urology will follow Intake/Output from previous day: 07/23 0701 - 07/24 0700 In: 5328.7 [I.V.:3714.8; IV Piggyback:1613.9] Out: -   Intake/Output this shift: Total I/O In: 1187.2 [I.V.:1128.4; IV Piggyback:58.9] Out: -   Physical Exam:  General: Alert and oriented CV: No cyanosis Lungs: equal chest rise Abdomen: Soft, NTND, no rebound or guarding  Lab Results: Recent Labs    10/24/23 1624 10/25/23 0009 10/25/23 0830  HGB 9.3* 7.3* 9.1*  HCT 27.9* 23.8* 29.5*   BMET Recent Labs    10/24/23 1624 10/25/23 0009 10/25/23 0328 10/25/23 0830  NA 127*  --  132*  --   K 4.2  --  4.3  --   CL 91*  --   103  --   CO2 20*  --  20*  --   GLUCOSE 135*  --  152*  --   BUN 29*  --  23  --   CREATININE 2.44*  --  1.88*  --   CALCIUM  9.4  --  8.3*  --   HGB 9.3* 7.3*  --  9.1*  WBC 12.1* 7.7  --  6.0     Studies/Results: DG C-Arm 1-60 Min-No Report Result Date: 10/24/2023 Fluoroscopy was utilized by the requesting physician.  No radiographic interpretation.   CT CHEST ABDOMEN PELVIS WO CONTRAST Result Date: 10/24/2023 CLINICAL DATA:  Respiratory illness, nondiagnostic xray ( for a while ) Sweating at night. 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 RADIATION DOSE REDUCTION: This exam was performed according to the departmental dose-optimization program which includes automated exposure control, adjustment of the mA and/or kV according to patient size and/or use of iterative reconstruction technique. COMPARISON:  CT abdomen pelvis 08/27/2020 FINDINGS: CT CHEST FINDINGS Cardiovascular: Normal heart size. No significant pericardial effusion. The thoracic aorta is normal in caliber. Mild atherosclerotic plaque of the thoracic aorta. No coronary artery calcifications. Mediastinum/Nodes: No enlarged mediastinal, hilar, or axillary lymph nodes. Thyroid  gland, trachea, and esophagus demonstrate no significant findings. Lungs/Pleura: No focal consolidation. No pulmonary nodule. No pulmonary mass. No pleural effusion. No  pneumothorax. Musculoskeletal: No chest wall abnormality. No suspicious lytic or blastic osseous lesions. No acute displaced fracture. CT ABDOMEN PELVIS FINDINGS Hepatobiliary: Chronic several pericentimeter and subcentimeter hypodensities within the bilateral patent lobes-likely simple cyst. No gallstones, gallbladder wall thickening, or pericholecystic fluid. No biliary dilatation. Pancreas: No focal lesion.  Normal pancreatic contour. No surrounding inflammatory changes. No main pancreatic ductal dilatation. Spleen: Normal in size without focal abnormality. Adrenals/Urinary Tract: No adrenal nodule bilaterally. Bilateral nephrolithiasis measuring up to 2 mm bilaterally. 6 mm calcified stone within the distal ureter with associated proximal mild hydroureteronephrosis. Associated urothelial thickening. Asymmetric right perinephric and periureteral fat stranding. No left ureterolithiasis.  No left hydronephrosis. The urinary bladder is unremarkable. Stomach/Bowel: Stomach is within normal limits. No evidence of bowel wall thickening or dilatation. Stool throughout the descending colon and rectosigmoid colon. Appendix appears normal. Vascular/Lymphatic: No abdominal aorta or iliac aneurysm. Mild atherosclerotic plaque of the aorta and its branches. No abdominal, pelvic, or inguinal lymphadenopathy. Reproductive: Atrophic uterus.  Bilateral adnexa are unremarkable. Other: No intraperitoneal free fluid. No intraperitoneal free gas. No organized fluid collection. Musculoskeletal: No abdominal wall hernia or abnormality. No suspicious lytic or blastic osseous lesions. No acute displaced fracture. IMPRESSION: 1. No acute intrathoracic abnormality. 2. Obstructive 6 mm distal ureterolithiasis with associated urothelial thickening-correlate for superimposed infection with urinalysis. 3. Nonobstructive bilateral nephrolithiasis measuring up to 2 mm. 4.  Aortic Atherosclerosis (ICD10-I70.0). Electronically Signed   By: Morgane  Naveau M.D.   On: 10/24/2023 18:24   DG Chest 2 View Result Date: 10/24/2023 CLINICAL DATA:  Fatigue, diaphoresis. EXAM: CHEST - 2 VIEW COMPARISON:  09/30/2012. FINDINGS: Trachea is midline. Heart size normal. Mild streaky bibasilar scarring or atelectasis. There may be trace bilateral pleural effusions. IMPRESSION: Streaky bibasilar scarring or atelectasis with possible trace bilateral pleural effusions.  Electronically Signed   By: Newell Eke M.D.   On: 10/24/2023 16:41      LOS: 1 day   Ole Bourdon, NP [facility] Pager: [phone]  10/25/2023, 2:14 PM

## 2023-10-25 NOTE — Plan of Care (Signed)

## 2023-10-25 NOTE — Plan of Care (Signed)
  Problem: Clinical Measurements: Goal: Respiratory complications will improve Outcome: Progressing   Problem: Activity: Goal: Risk for activity intolerance will decrease Outcome: Progressing   Problem: Elimination: Goal: Will not experience complications related to urinary retention Outcome: Progressing   Problem: Pain Managment: Goal: General experience of comfort will improve and/or be controlled Outcome: Progressing   Problem: Skin Integrity: Goal: Risk for impaired skin integrity will decrease Outcome: Progressing

## 2023-10-25 NOTE — Progress Notes (Signed)
 [facility]                                                                               Victoria Strickland, is a 63 y.o. female, DOB - 1960/12/20, FMW:[id number] Admit date - 10/24/2023    Outpatient Primary MD for the patient is Jarold Medici, MD  LOS - 1  days    Brief summary   Victoria Strickland is a 63 y.o. female with medical history significant for hypertension, hyperlipidemia and prior nephrolithiasis being admitted to the hospital with severe sepsis.  CT of the chest abdomen pelvis was unremarkable except for obstructing right-sided 6 mm renal stone. Patient has been transferred ER to ER, and is about to undergo urgent right-sided ureteroscopy and stent placement.    Assessment & Plan    Assessment and Plan:    Right Ureteral Calculus S/p right ureteral stent placement with Dr Elisabeth on 7/23. Follow up blood cultures and urine cultures.  Febrile up to 103.3,  BP parameters improving. Wbc count wnl.  Pain control.   Acute Kidney injury:  Improving with IV fluids.    Hyponatremia:  Improving.   Normocytic anemia Hemoglobin stable around 9.     Thrombocytopenia Suspect from infection. Monitor.   Estimated body mass index is 27.09 kg/m as calculated from the following:   Height as of this encounter: 5' 4 (1.626 m).   Weight as of this encounter: 71.6 kg.  Code Status: full code.  DVT Prophylaxis:  heparin  injection 5,000 Units Start: 10/25/23 0600   Level of Care: Level of care: Stepdown Family Communication: none at bedside.   Disposition Plan:     Remains inpatient appropriate:  pending.   Procedures:  Cystoscopy right ureteral stent placement right retrograde pyelography with interpretation   Consultants:   urology  Antimicrobials:   Anti-infectives (From admission, onward)    Start     Dose/Rate Route Frequency Ordered Stop   10/25/23 1800  ceFEPIme  (MAXIPIME ) 2 g in sodium chloride  0.9 % 100 mL IVPB        2 g 200 mL/hr  over 30 Minutes Intravenous Every 24 hours 10/24/23 2205     10/25/23 0600  metroNIDAZOLE  (FLAGYL ) IVPB 500 mg        500 mg 100 mL/hr over 60 Minutes Intravenous Every 12 hours 10/24/23 2147     10/24/23 2000  fluconazole  (DIFLUCAN ) tablet 200 mg  Status:  Discontinued        200 mg Oral  Once 10/24/23 1948 10/24/23 1957   10/24/23 2000  fluconazole  (DIFLUCAN ) tablet 150 mg        150 mg Oral  Once 10/24/23 1957 10/24/23 2019   10/24/23 1715  ceFEPIme  (MAXIPIME ) 2 g in sodium chloride  0.9 % 100 mL IVPB        2 g 200 mL/hr over 30 Minutes Intravenous  Once 10/24/23 1712 10/24/23 1858   10/24/23 1715  metroNIDAZOLE  (FLAGYL ) IVPB 500 mg        500 mg 100 mL/hr over 60 Minutes Intravenous  Once 10/24/23 1712 10/24/23 2018   10/24/23 1715  vancomycin  (VANCOCIN ) IVPB 1000 mg/200 mL premix  1,000 mg 200 mL/hr over 60 Minutes Intravenous  Once 10/24/23 1712 10/25/23 0152        Medications  Scheduled Meds:  Chlorhexidine  Gluconate Cloth  6 each Topical Q2200   heparin   5,000 Units Subcutaneous Q8H   Continuous Infusions:  ceFEPime  (MAXIPIME ) IV     lactated ringers      metronidazole  Stopped (10/25/23 0637)   PRN Meds:.acetaminophen  **OR** acetaminophen , albuterol , ondansetron  **OR** ondansetron  (ZOFRAN ) IV, oxyCODONE , traZODone     Subjective:   Bryah [place] was seen and examined today.   Objective:   Vitals:   10/25/23 1000 10/25/23 1100 10/25/23 1200 10/25/23 1300  BP: 128/71 120/74 133/69 127/72  Pulse: (!) 57 (!) 59 (!) 58 (!) 57  Resp: 14 (!) 21 17 16   Temp:   98 F (36.7 C)   TempSrc:   Oral   SpO2: 98% 98% 99% 98%  Weight:      Height:        Intake/Output Summary (Last 24 hours) at 10/25/2023 1340 Last data filed at 10/25/2023 0600 Gross per 24 hour  Intake 5328.67 ml  Output --  Net 5328.67 ml   Filed Weights   10/24/23 2200 10/24/23 2333  Weight: 68.9 kg 71.6 kg     Exam General exam: Appears calm and comfortable  Respiratory system: Clear  to auscultation. Respiratory effort normal. Cardiovascular system: S1 & S2 heard, RRR. No JVD,  Gastrointestinal system: Abdomen is nondistended, soft and nontender.  Central nervous system: Alert and oriented. No focal neurological deficits. Extremities: Symmetric 5 x 5 power. Skin: No rashes, Psychiatry: Mood & affect appropriate.    Data Reviewed:  I have personally reviewed following labs and imaging studies   CBC Lab Results  Component Value Date   WBC 6.0 10/25/2023   RBC 3.49 (L) 10/25/2023   HGB 9.1 (L) 10/25/2023   HCT 29.5 (L) 10/25/2023   MCV 84.5 10/25/2023   MCH 26.1 10/25/2023   PLT 107 (L) 10/25/2023   MCHC 30.8 10/25/2023   RDW 14.5 10/25/2023   LYMPHSABS 1.2 10/24/2023   MONOABS 0.8 10/24/2023   EOSABS 0.0 10/24/2023   BASOSABS 0.0 10/24/2023     Last metabolic panel Lab Results  Component Value Date   NA 132 (L) 10/25/2023   K 4.3 10/25/2023   CL 103 10/25/2023   CO2 20 (L) 10/25/2023   BUN 23 10/25/2023   CREATININE 1.88 (H) 10/25/2023   GLUCOSE 152 (H) 10/25/2023   GFRNONAA 29 (L) 10/25/2023   GFRAA 70 03/22/2020   CALCIUM  8.3 (L) 10/25/2023   PROT 7.7 10/24/2023   ALBUMIN 3.9 10/24/2023   LABGLOB 3.0 10/02/2023   AGRATIO 1.6 02/22/2022   BILITOT 1.2 10/24/2023   ALKPHOS 56 10/24/2023   AST 64 (H) 10/24/2023   ALT 38 10/24/2023   ANIONGAP 9 10/25/2023    CBG (last 3)  No results for input(s): GLUCAP in the last 72 hours.    Coagulation Profile: No results for input(s): INR, PROTIME in the last 168 hours.   Radiology Studies: DG C-Arm 1-60 Min-No Report Result Date: 10/24/2023 Fluoroscopy was utilized by the requesting physician.  No radiographic interpretation.   CT CHEST ABDOMEN PELVIS WO CONTRAST Result Date: 10/24/2023 CLINICAL DATA:  Respiratory illness, nondiagnostic xray ( for a while ) Sweating at night. On and off for last 2 weeks Works outside, EXAM: CT CHEST, ABDOMEN AND PELVIS WITHOUT CONTRAST TECHNIQUE:  Multidetector CT imaging of the chest, abdomen and pelvis was performed following the standard protocol without  IV contrast. RADIATION DOSE REDUCTION: This exam was performed according to the departmental dose-optimization program which includes automated exposure control, adjustment of the mA and/or kV according to patient size and/or use of iterative reconstruction technique. COMPARISON:  CT abdomen pelvis 08/27/2020 FINDINGS: CT CHEST FINDINGS Cardiovascular: Normal heart size. No significant pericardial effusion. The thoracic aorta is normal in caliber. Mild atherosclerotic plaque of the thoracic aorta. No coronary artery calcifications. Mediastinum/Nodes: No enlarged mediastinal, hilar, or axillary lymph nodes. Thyroid  gland, trachea, and esophagus demonstrate no significant findings. Lungs/Pleura: No focal consolidation. No pulmonary nodule. No pulmonary mass. No pleural effusion. No pneumothorax. Musculoskeletal: No chest wall abnormality. No suspicious lytic or blastic osseous lesions. No acute displaced fracture. CT ABDOMEN PELVIS FINDINGS Hepatobiliary: Chronic several pericentimeter and subcentimeter hypodensities within the bilateral patent lobes-likely simple cyst. No gallstones, gallbladder wall thickening, or pericholecystic fluid. No biliary dilatation. Pancreas: No focal lesion. Normal pancreatic contour. No surrounding inflammatory changes. No main pancreatic ductal dilatation. Spleen: Normal in size without focal abnormality. Adrenals/Urinary Tract: No adrenal nodule bilaterally. Bilateral nephrolithiasis measuring up to 2 mm bilaterally. 6 mm calcified stone within the distal ureter with associated proximal mild hydroureteronephrosis. Associated urothelial thickening. Asymmetric right perinephric and periureteral fat stranding. No left ureterolithiasis.  No left hydronephrosis. The urinary bladder is unremarkable. Stomach/Bowel: Stomach is within normal limits. No evidence of bowel wall  thickening or dilatation. Stool throughout the descending colon and rectosigmoid colon. Appendix appears normal. Vascular/Lymphatic: No abdominal aorta or iliac aneurysm. Mild atherosclerotic plaque of the aorta and its branches. No abdominal, pelvic, or inguinal lymphadenopathy. Reproductive: Atrophic uterus.  Bilateral adnexa are unremarkable. Other: No intraperitoneal free fluid. No intraperitoneal free gas. No organized fluid collection. Musculoskeletal: No abdominal wall hernia or abnormality. No suspicious lytic or blastic osseous lesions. No acute displaced fracture. IMPRESSION: 1. No acute intrathoracic abnormality. 2. Obstructive 6 mm distal ureterolithiasis with associated urothelial thickening-correlate for superimposed infection with urinalysis. 3. Nonobstructive bilateral nephrolithiasis measuring up to 2 mm. 4.  Aortic Atherosclerosis (ICD10-I70.0). Electronically Signed   By: Morgane  Naveau M.D.   On: 10/24/2023 18:24   DG Chest 2 View Result Date: 10/24/2023 CLINICAL DATA:  Fatigue, diaphoresis. EXAM: CHEST - 2 VIEW COMPARISON:  09/30/2012. FINDINGS: Trachea is midline. Heart size normal. Mild streaky bibasilar scarring or atelectasis. There may be trace bilateral pleural effusions. IMPRESSION: Streaky bibasilar scarring or atelectasis with possible trace bilateral pleural effusions. Electronically Signed   By: Newell Eke M.D.   On: 10/24/2023 16:41       Elgie Butter M.D. [facility] 10/25/2023, 1:40 PM  Available via Epic secure chat 7am-7pm After 7 pm, please refer to night coverage provider listed on amion.

## 2023-10-25 NOTE — Progress Notes (Signed)
     Patient Name: Victoria Strickland           DOB: 1960/04/08  MRN: [id number]      Admission Date: 10/24/2023  Attending Provider: Zella Katha HERO, MD  Primary Diagnosis: Severe sepsis [facility])   Level of care: Stepdown   CROSS COVERAGE NOTE       Date of Service   10/25/2023   Nasia Cannan, 63 y.o. female, was admitted on 10/24/2023 for Severe sepsis [facility]) related to urinary source.   Received 2 L fluid bolus and was started on broad-spectrum abx. CT abdomen pelvis revealed a 6 mm right-sided obstructing renal stone.  Given concern for sepsis, patient was transferred to [facility] for urgent right urethral stent placement.   HPI/Events of Note   Sepsis secondary to urinary source Hypotensive, 80/50 Patient just completed an additional liter bolus ordered in PACU due to low BP.  A total of 3 L boluses have been administered since admission.   Patient is A/O x 4.  Asymptomatic.  Afebrile. Making adequate urine output. No obvious bleeding reported.  Minimal blood loss during surgery.     Addendum: BP improved with fluid bolus, but is slowly coming back down again.   SBP 80-90 with MAP< 65.  Additional 500 cc bolus and 5 mg midodrine  ordered. Lactic acid negative, hemoglobin 9.3-->7.3 could be hemodiluted.  Monitor for bleeding.   Interventions/ Plan   1.5 L bolus tonight (total 4.5 L bolus +) Continue maintenance fluid CBC  lactic acid Midodrine , 5 mg       Delanna Blacketer, DNP, ACNPC- AG [facility] [place]

## 2023-10-25 NOTE — Anesthesia Postprocedure Evaluation (Signed)
 Anesthesia Post Note  Patient: Victoria Strickland  Procedure(s) Performed: CYSTOSCOPY, WITH STENT INSERTION (Right: Ureter)     Patient location during evaluation: PACU Anesthesia Type: General Level of consciousness: awake and alert Pain management: pain level controlled Vital Signs Assessment: post-procedure vital signs reviewed and stable Respiratory status: spontaneous breathing, nonlabored ventilation, respiratory function stable and patient connected to nasal cannula oxygen Cardiovascular status: blood pressure returned to baseline and stable Postop Assessment: no apparent nausea or vomiting Anesthetic complications: no   No notable events documented.  Last Vitals:  Vitals:   10/25/23 1518 10/25/23 1545  BP:  110/69  Pulse:  62  Resp:  17  Temp: 36.8 C 36.9 C  SpO2:  98%    Last Pain:  Vitals:   10/25/23 1518  TempSrc: Oral  PainSc:                  Victoria Strickland

## 2023-10-26 DIAGNOSIS — N3001 Acute cystitis with hematuria: Secondary | ICD-10-CM

## 2023-10-26 DIAGNOSIS — N200 Calculus of kidney: Secondary | ICD-10-CM | POA: Diagnosis not present

## 2023-10-26 DIAGNOSIS — N179 Acute kidney failure, unspecified: Secondary | ICD-10-CM | POA: Diagnosis not present

## 2023-10-26 LAB — BASIC METABOLIC PANEL WITH GFR
Anion gap: 7 (ref 5–15)
BUN: 26 mg/dL — ABNORMAL HIGH (ref 8–23)
CO2: 23 mmol/L (ref 22–32)
Calcium: 8.8 mg/dL — ABNORMAL LOW (ref 8.9–10.3)
Chloride: 104 mmol/L (ref 98–111)
Creatinine, Ser: 1.44 mg/dL — ABNORMAL HIGH (ref 0.44–1.00)
GFR, Estimated: 41 mL/min — ABNORMAL LOW (ref >60)
Glucose, Bld: 141 mg/dL — ABNORMAL HIGH (ref 70–99)
Potassium: 4 mmol/L (ref 3.5–5.1)
Sodium: 134 mmol/L — ABNORMAL LOW (ref 135–145)

## 2023-10-26 LAB — CBC WITH DIFFERENTIAL/PLATELET
Abs Immature Granulocytes: 0.18 K/uL — ABNORMAL HIGH (ref 0.00–0.07)
Basophils Absolute: 0 K/uL (ref 0.0–0.1)
Basophils Relative: 0 %
Eosinophils Absolute: 0 K/uL (ref 0.0–0.5)
Eosinophils Relative: 0 %
HCT: 28.5 % — ABNORMAL LOW (ref 36.0–46.0)
Hemoglobin: 9.1 g/dL — ABNORMAL LOW (ref 12.0–15.0)
Immature Granulocytes: 1 %
Lymphocytes Relative: 8 %
Lymphs Abs: 1.1 K/uL (ref 0.7–4.0)
MCH: 25.8 pg — ABNORMAL LOW (ref 26.0–34.0)
MCHC: 31.9 g/dL (ref 30.0–36.0)
MCV: 80.7 fL (ref 80.0–100.0)
Monocytes Absolute: 0.4 K/uL (ref 0.1–1.0)
Monocytes Relative: 3 %
Neutro Abs: 11.5 K/uL — ABNORMAL HIGH (ref 1.7–7.7)
Neutrophils Relative %: 88 %
Platelets: 162 K/uL (ref 150–400)
RBC: 3.53 MIL/uL — ABNORMAL LOW (ref 3.87–5.11)
RDW: 14.2 % (ref 11.5–15.5)
WBC: 13.2 K/uL — ABNORMAL HIGH (ref 4.0–10.5)
nRBC: 0 % (ref 0.0–0.2)

## 2023-10-26 LAB — URINE CULTURE: Culture: >=100000 — AB

## 2023-10-26 MED ORDER — AMOXICILLIN 500 MG PO CAPS
500.0000 mg | ORAL_CAPSULE | Freq: Three times a day (TID) | ORAL | Status: DC
  Administered 2023-10-26 – 2023-10-27 (×3): 500 mg via ORAL
  Filled 2023-10-26 (×4): qty 1

## 2023-10-26 NOTE — Plan of Care (Signed)

## 2023-10-26 NOTE — TOC Initial Note (Signed)
 Transition of Care [facility]) - Initial/Assessment Note    Patient Details  Name: Victoria Strickland MRN: [id number] Date of Birth: 1960-05-20  Transition of Care [facility]) CM/SW Contact:    Victoria Glenys DASEN, RN  Clinical Narrative:                 Presented for hematochezia. CM met with patient and walked around unit. PTA states lives in a house with Victoria, Strickland (Spouse) [phone] PCP/insurance; denies DME, HH, oxygen, SDOH needs; patients spouse will transport home at discharge. No CM needs identified during visit. Please consult if needs present.  Expected Discharge Plan: Home/Self Care Barriers to Discharge: No Barriers Identified   Patient Goals and CMS Choice Patient states their goals for this hospitalization and ongoing recovery are:: Home with spouse CMS Medicare.gov Compare Post Acute Care list provided to::  (NA) Choice offered to / list presented to : NA [place] ownership interest in [facility].provided to:: Parent NA    Expected Discharge Plan and Services In-house Referral: NA Discharge Planning Services: NA Post Acute Care Choice: NA Living arrangements for the past 2 months: Single Family Home                 DME Arranged: N/A DME Agency: NA       HH Arranged: NA HH Agency: NA        Prior Living Arrangements/Services Living arrangements for the past 2 months: Single Family Home Lives with:: Spouse Patient language and need for interpreter reviewed:: No Do you feel safe going back to the place where you live?: Yes      Need for Family Participation in Patient Care: No (Comment) Care giver support system in place?: Yes (comment) Current home services:  (NA) Criminal Activity/Legal Involvement Pertinent to Current Situation/Hospitalization: No - Comment as needed  Activities of Daily Living   ADL Screening (condition at time of admission) Independently performs ADLs?: Yes (appropriate for developmental age) Is the patient deaf  or have difficulty hearing?: No Does the patient have difficulty seeing, even when wearing glasses/contacts?: No Does the patient have difficulty concentrating, remembering, or making decisions?: No  Permission Sought/Granted Permission sought to share information with : Case Manager Permission granted to share information with : Yes, Verbal Permission Granted              Emotional Assessment Appearance:: Appears stated age Attitude/Demeanor/Rapport: Engaged Affect (typically observed): Appropriate Orientation: : Oriented to Self, Oriented to Place, Oriented to  Time, Oriented to Situation Alcohol / Substance Use: Not Applicable Psych Involvement: No (comment)  Admission diagnosis:  Kidney stone [N20.0] AKI (acute kidney injury) (HCC) [N17.9] Urinary tract infection with hematuria, site unspecified [N39.0, R31.9] Sepsis with acute renal failure without septic shock, due to unspecified organism, unspecified acute renal failure type (HCC) [A41.9, R65.20, N17.9] Severe sepsis (HCC) [A41.9, R65.20] Patient Active Problem List   Diagnosis Date Noted   Kidney stone 10/25/2023   Urinary tract infection with hematuria 10/25/2023   AKI (acute kidney injury) (HCC) 10/25/2023   Severe sepsis (HCC) 10/24/2023   Unintentional weight loss 10/06/2023   Routine general medical examination at health care facility 10/02/2023   Pure hypercholesterolemia 10/02/2023   Essential hypertension, benign 07/22/2018   PCP:  Jarold Medici, MD Pharmacy:   [facility] [phone] - [place] - [address] AT [address] [place] [address] Phone: [phone] Fax: [phone]     Social Drivers of Health (SDOH) Social History: SDOH Screenings  Food Insecurity: No Food Insecurity (10/25/2023)  Housing: Low Risk  (10/25/2023)  Transportation Needs: No Transportation Needs (10/25/2023)  Utilities: Not At Risk (10/25/2023)  Alcohol Screen: Low Risk  (12/06/2022)   Depression (PHQ2-9): Low Risk  (10/02/2023)  Physical Activity: Sufficiently Active (12/06/2022)  Tobacco Use: Low Risk  (10/24/2023)   SDOH Interventions:     Readmission Risk Interventions    10/26/2023   10:38 AM  Readmission Risk Prevention Plan  Post Dischage Appt Complete  Medication Screening Complete  Transportation Screening Complete

## 2023-10-26 NOTE — Progress Notes (Signed)
 [facility]                                                                               Victoria Strickland, is a 63 y.o. female, DOB - December 30, 1960, FMW:[id number] Admit date - 10/24/2023    Outpatient Primary MD for the patient is Victoria Medici, MD  LOS - 2  days    Brief summary   Victoria Strickland is a 63 y.o. female with medical history significant for hypertension, hyperlipidemia and prior nephrolithiasis being admitted to the hospital with severe sepsis.  CT of the chest abdomen pelvis was unremarkable except for obstructing right-sided 6 mm renal stone. Patient has been transferred ER to ER, and is about to undergo urgent right-sided ureteroscopy and stent placement.    Assessment & Plan    Assessment and Plan:    Right Ureteral Calculus S/p right ureteral stent placement with Dr Elisabeth on 7/23. Urine cultures growing enterococcus, transitioned to oral amoxicillin 500 mg TID.Complete 10 day course.  Afebrile BP parameters are improving.  Wbc count wnl.  Pain control.   Acute Kidney injury:  Creatinine of 1.4 today.    Hyponatremia:  Improving. Sodium has improved to 134.   Normocytic anemia Hemoglobin stable around 9.   Leukocytosis Suspect from UTI   Thrombocytopenia Suspect from infection.  Resolved.   Estimated body mass index is 27.09 kg/m as calculated from the following:   Height as of this encounter: 5' 4 (1.626 m).   Weight as of this encounter: 71.6 kg.  Code Status: full code.  DVT Prophylaxis:  heparin  injection 5,000 Units Start: 10/25/23 0600   Level of Care: Level of care: Med-Surg Family Communication: none at bedside.   Disposition Plan:     Remains inpatient appropriate:  pending.   Procedures:  Cystoscopy right ureteral stent placement right retrograde pyelography with interpretation   Consultants:   urology  Antimicrobials:   Anti-infectives (From admission, onward)    Start     Dose/Rate Route Frequency  Ordered Stop   10/26/23 1400  amoxicillin (AMOXIL) capsule 500 mg        500 mg Oral Every 8 hours 10/26/23 0931 11/05/23 1359   10/25/23 1800  ceFEPIme  (MAXIPIME ) 2 g in sodium chloride  0.9 % 100 mL IVPB  Status:  Discontinued        2 g 200 mL/hr over 30 Minutes Intravenous Every 24 hours 10/24/23 2205 10/26/23 0931   10/25/23 0600  metroNIDAZOLE  (FLAGYL ) IVPB 500 mg  Status:  Discontinued        500 mg 100 mL/hr over 60 Minutes Intravenous Every 12 hours 10/24/23 2147 10/26/23 0931   10/24/23 2000  fluconazole  (DIFLUCAN ) tablet 200 mg  Status:  Discontinued        200 mg Oral  Once 10/24/23 1948 10/24/23 1957   10/24/23 2000  fluconazole  (DIFLUCAN ) tablet 150 mg        150 mg Oral  Once 10/24/23 1957 10/24/23 2019   10/24/23 1715  ceFEPIme  (MAXIPIME ) 2 g in sodium chloride  0.9 % 100 mL IVPB        2 g 200 mL/hr over 30 Minutes Intravenous  Once 10/24/23 1712  10/24/23 1858   10/24/23 1715  metroNIDAZOLE  (FLAGYL ) IVPB 500 mg        500 mg 100 mL/hr over 60 Minutes Intravenous  Once 10/24/23 1712 10/24/23 2018   10/24/23 1715  vancomycin  (VANCOCIN ) IVPB 1000 mg/200 mL premix        1,000 mg 200 mL/hr over 60 Minutes Intravenous  Once 10/24/23 1712 10/25/23 0152        Medications  Scheduled Meds:  amoxicillin  500 mg Oral Q8H   Chlorhexidine  Gluconate Cloth  6 each Topical Q2200   heparin   5,000 Units Subcutaneous Q8H   Continuous Infusions:   PRN Meds:.acetaminophen  **OR** acetaminophen , albuterol , ondansetron  **OR** ondansetron  (ZOFRAN ) IV, oxyCODONE , traZODone     Subjective:   Victoria Strickland was seen and examined today. No new complaints.   Objective:   Vitals:   10/25/23 1545 10/25/23 1938 10/26/23 0430 10/26/23 1218  BP: 110/69 135/73 123/65 120/66  Pulse: 62 (!) 59 (!) 55 (!) 58  Resp: 17 18 18 17   Temp: 98.5 F (36.9 C) 97.9 F (36.6 C) 97.6 F (36.4 C) 97.6 F (36.4 C)  TempSrc:      SpO2: 98% 100% 99% 99%  Weight:      Height:        Intake/Output  Summary (Last 24 hours) at 10/26/2023 1737 Last data filed at 10/26/2023 0925 Gross per 24 hour  Intake 250 ml  Output --  Net 250 ml   Filed Weights   10/24/23 2200 10/24/23 2333  Weight: 68.9 kg 71.6 kg     Exam General exam: Appears calm and comfortable  Respiratory system: Clear to auscultation. Respiratory effort normal. Cardiovascular system: S1 & S2 heard, RRR. No JVD,  Gastrointestinal system: Abdomen is nondistended, soft and nontender.  Central nervous system: Alert and oriented. No focal neurological deficits. Extremities: Symmetric 5 x 5 power. Skin: No rashes,  Psychiatry:  Mood & affect appropriate.     Data Reviewed:  I have personally reviewed following labs and imaging studies   CBC Lab Results  Component Value Date   WBC 13.2 (H) 10/26/2023   RBC 3.53 (L) 10/26/2023   HGB 9.1 (L) 10/26/2023   HCT 28.5 (L) 10/26/2023   MCV 80.7 10/26/2023   MCH 25.8 (L) 10/26/2023   PLT 162 10/26/2023   MCHC 31.9 10/26/2023   RDW 14.2 10/26/2023   LYMPHSABS 1.1 10/26/2023   MONOABS 0.4 10/26/2023   EOSABS 0.0 10/26/2023   BASOSABS 0.0 10/26/2023     Last metabolic panel Lab Results  Component Value Date   NA 134 (L) 10/26/2023   K 4.0 10/26/2023   CL 104 10/26/2023   CO2 23 10/26/2023   BUN 26 (H) 10/26/2023   CREATININE 1.44 (H) 10/26/2023   GLUCOSE 141 (H) 10/26/2023   GFRNONAA 41 (L) 10/26/2023   GFRAA 70 03/22/2020   CALCIUM  8.8 (L) 10/26/2023   PROT 7.7 10/24/2023   ALBUMIN 3.9 10/24/2023   LABGLOB 3.0 10/02/2023   AGRATIO 1.6 02/22/2022   BILITOT 1.2 10/24/2023   ALKPHOS 56 10/24/2023   AST 64 (H) 10/24/2023   ALT 38 10/24/2023   ANIONGAP 7 10/26/2023    CBG (last 3)  No results for input(s): GLUCAP in the last 72 hours.    Coagulation Profile: No results for input(s): INR, PROTIME in the last 168 hours.   Radiology Studies: DG C-Arm 1-60 Min-No Report Result Date: 10/24/2023 Fluoroscopy was utilized by the requesting  physician.  No radiographic interpretation.   CT CHEST  ABDOMEN PELVIS WO CONTRAST Result Date: 10/24/2023 CLINICAL DATA:  Respiratory illness, nondiagnostic xray ( for a while ) Sweating at night. 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 RADIATION DOSE REDUCTION: This exam was performed according to the departmental dose-optimization program which includes automated exposure control, adjustment of the mA and/or kV according to patient size and/or use of iterative reconstruction technique. COMPARISON:  CT abdomen pelvis 08/27/2020 FINDINGS: CT CHEST FINDINGS Cardiovascular: Normal heart size. No significant pericardial effusion. The thoracic aorta is normal in caliber. Mild atherosclerotic plaque of the thoracic aorta. No coronary artery calcifications. Mediastinum/Nodes: No enlarged mediastinal, hilar, or axillary lymph nodes. Thyroid  gland, trachea, and esophagus demonstrate no significant findings. Lungs/Pleura: No focal consolidation. No pulmonary nodule. No pulmonary mass. No pleural effusion. No pneumothorax. Musculoskeletal: No chest wall abnormality. No suspicious lytic or blastic osseous lesions. No acute displaced fracture. CT ABDOMEN PELVIS FINDINGS Hepatobiliary: Chronic several pericentimeter and subcentimeter hypodensities within the bilateral patent lobes-likely simple cyst. No gallstones, gallbladder wall thickening, or pericholecystic fluid. No biliary dilatation. Pancreas: No focal lesion. Normal pancreatic contour. No surrounding inflammatory changes. No main pancreatic ductal dilatation. Spleen: Normal in size without focal abnormality. Adrenals/Urinary Tract: No adrenal nodule bilaterally. Bilateral nephrolithiasis measuring up to 2 mm bilaterally. 6 mm calcified stone within the distal ureter with  associated proximal mild hydroureteronephrosis. Associated urothelial thickening. Asymmetric right perinephric and periureteral fat stranding. No left ureterolithiasis.  No left hydronephrosis. The urinary bladder is unremarkable. Stomach/Bowel: Stomach is within normal limits. No evidence of bowel wall thickening or dilatation. Stool throughout the descending colon and rectosigmoid colon. Appendix appears normal. Vascular/Lymphatic: No abdominal aorta or iliac aneurysm. Mild atherosclerotic plaque of the aorta and its branches. No abdominal, pelvic, or inguinal lymphadenopathy. Reproductive: Atrophic uterus.  Bilateral adnexa are unremarkable. Other: No intraperitoneal free fluid. No intraperitoneal free gas. No organized fluid collection. Musculoskeletal: No abdominal wall hernia or abnormality. No suspicious lytic or blastic osseous lesions. No acute displaced fracture. IMPRESSION: 1. No acute intrathoracic abnormality. 2. Obstructive 6 mm distal ureterolithiasis with associated urothelial thickening-correlate for superimposed infection with urinalysis. 3. Nonobstructive bilateral nephrolithiasis measuring up to 2 mm. 4.  Aortic Atherosclerosis (ICD10-I70.0). Electronically Signed   By: Morgane  Naveau M.D.   On: 10/24/2023 18:24       Elgie Butter M.D. [facility] 10/26/2023, 5:37 PM  Available via Epic secure chat 7am-7pm After 7 pm, please refer to night coverage provider listed on amion.

## 2023-10-26 NOTE — Progress Notes (Signed)
 Urology Inpatient Progress Report    Intv/Subj: No overnight events. Tolerating stent well.  Kidney function improving.  Urine culture has resulted.   Principal Problem:   Severe sepsis (HCC) Active Problems:   Kidney stone   Urinary tract infection with hematuria   AKI (acute kidney injury) (HCC)  Current Facility-Administered Medications  Medication Dose Route Frequency Provider Last Rate Last Admin   acetaminophen  (TYLENOL ) tablet 650 mg  650 mg Oral Q6H PRN Cherlyn Labella, MD       Or   acetaminophen  (TYLENOL ) suppository 650 mg  650 mg Rectal Q6H PRN Cherlyn Labella, MD       albuterol  (PROVENTIL ) (2.5 MG/3ML) 0.083% nebulizer solution 2.5 mg  2.5 mg Nebulization Q2H PRN Akula, Vijaya, MD       amoxicillin (AMOXIL) capsule 500 mg  500 mg Oral Q8H Akula, Vijaya, MD       Chlorhexidine  Gluconate Cloth 2 % PADS 6 each  6 each Topical Q2200 Akula, Vijaya, MD       heparin  injection 5,000 Units  5,000 Units Subcutaneous Q8H Akula, Vijaya, MD   5,000 Units at 10/25/23 2117   ondansetron  (ZOFRAN ) tablet 4 mg  4 mg Oral Q6H PRN Cherlyn Labella, MD       Or   ondansetron  (ZOFRAN ) injection 4 mg  4 mg Intravenous Q6H PRN Akula, Vijaya, MD       oxyCODONE  (Oxy IR/ROXICODONE ) immediate release tablet 5 mg  5 mg Oral Q4H PRN Akula, Vijaya, MD       traZODone  (DESYREL ) tablet 25 mg  25 mg Oral QHS PRN Cherlyn Labella, MD         Objective: Vital: Vitals:   10/25/23 1518 10/25/23 1545 10/25/23 1938 10/26/23 0430  BP:  110/69 135/73 123/65  Pulse:  62 (!) 59 (!) 55  Resp:  17 18 18   Temp: 98.2 F (36.8 C) 98.5 F (36.9 C) 97.9 F (36.6 C) 97.6 F (36.4 C)  TempSrc: Oral     SpO2:  98% 100% 99%  Weight:      Height:       I/Os: I/O last 3 completed shifts: In: 6504.2 [I.V.:4931.5; IV Piggyback:1572.8] Out: -   Physical Exam:  General: Patient is in no apparent distress Lungs: Normal respiratory effort, chest expands symmetrically. GI: abdomen is soft and nontender without  mass. Ext: lower extremities symmetric  Lab Results: Recent Labs    10/25/23 0009 10/25/23 0830 10/26/23 0412  WBC 7.7 6.0 13.2*  HGB 7.3* 9.1* 9.1*  HCT 23.8* 29.5* 28.5*   Recent Labs    10/24/23 1624 10/25/23 0328 10/26/23 0412  NA 127* 132* 134*  K 4.2 4.3 4.0  CL 91* 103 104  CO2 20* 20* 23  GLUCOSE 135* 152* 141*  BUN 29* 23 26*  CREATININE 2.44* 1.88* 1.44*  CALCIUM  9.4 8.3* 8.8*   No results for input(s): LABPT, INR in the last 72 hours. No results for input(s): LABURIN in the last 72 hours. Results for orders placed or performed during the hospital encounter of 10/24/23  Resp panel by RT-PCR (RSV, Flu A&B, Covid) Anterior Nasal Swab     Status: None   Collection Time: 10/24/23  4:24 PM   Specimen: Anterior Nasal Swab  Result Value Ref Range Status   SARS Coronavirus 2 by RT PCR NEGATIVE NEGATIVE Final    Comment: (NOTE) SARS-CoV-2 target nucleic acids are NOT DETECTED.  The SARS-CoV-2 RNA is generally detectable in upper respiratory specimens during the acute phase  of infection. The lowest concentration of SARS-CoV-2 viral copies this assay can detect is 138 copies/mL. A negative result does not preclude SARS-Cov-2 infection and should not be used as the sole basis for treatment or other patient management decisions. A negative result may occur with  improper specimen collection/handling, submission of specimen other than nasopharyngeal swab, presence of viral mutation(s) within the areas targeted by this assay, and inadequate number of viral copies(<138 copies/mL). A negative result must be combined with clinical observations, patient history, and epidemiological information. The expected result is Negative.  Fact Sheet for Patients:  BloggerCourse.com  Fact Sheet for Healthcare Providers:  SeriousBroker.it  This test is no t yet approved or cleared by the [place]  FDA and  has been  authorized for detection and/or diagnosis of SARS-CoV-2 by FDA under an Emergency Use Authorization (EUA). This EUA will remain  in effect (meaning this test can be used) for the duration of the COVID-19 declaration under Section 564(b)(1) of the Act, 21 U.S.C.section 360bbb-3(b)(1), unless the authorization is terminated  or revoked sooner.       Influenza A by PCR NEGATIVE NEGATIVE Final   Influenza B by PCR NEGATIVE NEGATIVE Final    Comment: (NOTE) The Xpert Xpress SARS-CoV-2/FLU/RSV plus assay is intended as an aid in the diagnosis of influenza from Nasopharyngeal swab specimens and should not be used as a sole basis for treatment. Nasal washings and aspirates are unacceptable for Xpert Xpress SARS-CoV-2/FLU/RSV testing.  Fact Sheet for Patients: BloggerCourse.com  Fact Sheet for Healthcare Providers: SeriousBroker.it  This test is not yet approved or cleared by the [place]  FDA and has been authorized for detection and/or diagnosis of SARS-CoV-2 by FDA under an Emergency Use Authorization (EUA). This EUA will remain in effect (meaning this test can be used) for the duration of the COVID-19 declaration under Section 564(b)(1) of the Act, 21 U.S.C. section 360bbb-3(b)(1), unless the authorization is terminated or revoked.     Resp Syncytial Virus by PCR NEGATIVE NEGATIVE Final    Comment: (NOTE) Fact Sheet for Patients: BloggerCourse.com  Fact Sheet for Healthcare Providers: SeriousBroker.it  This test is not yet approved or cleared by the [place]  FDA and has been authorized for detection and/or diagnosis of SARS-CoV-2 by FDA under an Emergency Use Authorization (EUA). This EUA will remain in effect (meaning this test can be used) for the duration of the COVID-19 declaration under Section 564(b)(1) of the Act, 21 U.S.C. section 360bbb-3(b)(1), unless the  authorization is terminated or revoked.  Performed at [facility], [address], [place], [place] [address]   Blood culture (routine x 2)     Status: None (Preliminary result)   Collection Time: 10/24/23  5:09 PM   Specimen: BLOOD  Result Value Ref Range Status   Specimen Description   Final    BLOOD LEFT ANTECUBITAL Performed at [facility], [address] [place], [place] [address]    Special Requests   Final    BOTTLES DRAWN AEROBIC AND ANAEROBIC Blood Culture adequate volume Performed at [facility], [address] [place], [place] [address]    Culture   Final    NO GROWTH 1 DAY Performed at [facility], [address] [place], [place] [address]    Report Status PENDING  Incomplete  Blood culture (routine x 2)     Status: None (Preliminary result)   Collection Time: 10/24/23  5:14 PM   Specimen: BLOOD  Result Value Ref Range Status  Specimen Description   Final    BLOOD RIGHT ANTECUBITAL Performed at [facility], [address] [place], [place] [address]    Special Requests   Final    BOTTLES DRAWN AEROBIC AND ANAEROBIC Blood Culture adequate volume Performed at [facility], [address] [place], [place] [address]    Culture   Final    NO GROWTH 1 DAY Performed at [facility], [address], [place], [place] [address]    Report Status PENDING  Incomplete  Urine Culture     Status: Abnormal   Collection Time: 10/24/23  6:15 PM   Specimen: Urine, Random  Result Value Ref Range Status   Specimen Description   Final    URINE, RANDOM Performed at [facility], [address] [place], [place] [address]    Special Requests   Final    NONE Reflexed from [id number] Performed at [facility], [address] [place], [place] [address]    Culture >=100,000 COLONIES/mL ENTEROCOCCUS FAECALIS (A)  Final    Report Status 10/26/2023 FINAL  Final   Organism ID, Bacteria ENTEROCOCCUS FAECALIS (A)  Final      Susceptibility   Enterococcus faecalis - MIC*    AMPICILLIN <=2 SENSITIVE Sensitive     NITROFURANTOIN <=16 SENSITIVE Sensitive     VANCOMYCIN  1 SENSITIVE Sensitive     * >=100,000 COLONIES/mL ENTEROCOCCUS FAECALIS  MRSA Next Gen by PCR, Nasal     Status: None   Collection Time: 10/24/23 11:38 PM   Specimen: Nasal Mucosa; Nasal Swab  Result Value Ref Range Status   MRSA by PCR Next Gen NOT DETECTED NOT DETECTED Final    Comment: (NOTE) The GeneXpert MRSA Assay (FDA approved for NASAL specimens only), is one component of a comprehensive MRSA colonization surveillance program. It is not intended to diagnose MRSA infection nor to guide or monitor treatment for MRSA infections. Test performance is not FDA approved in patients less than 23 years old. Performed at [facility], [address] [place], [place] [address]   Respiratory (~20 pathogens) panel by PCR     Status: None   Collection Time: 10/25/23  6:43 AM   Specimen: Nasopharyngeal Swab; Respiratory  Result Value Ref Range Status   Adenovirus NOT DETECTED NOT DETECTED Final   Coronavirus 229E NOT DETECTED NOT DETECTED Final    Comment: (NOTE) The Coronavirus on the Respiratory Panel, DOES NOT test for the novel  Coronavirus (2019 nCoV)    Coronavirus HKU1 NOT DETECTED NOT DETECTED Final   Coronavirus NL63 NOT DETECTED NOT DETECTED Final   Coronavirus OC43 NOT DETECTED NOT DETECTED Final   Metapneumovirus NOT DETECTED NOT DETECTED Final   Rhinovirus / Enterovirus NOT DETECTED NOT DETECTED Final   Influenza A NOT DETECTED NOT DETECTED Final   Influenza B NOT DETECTED NOT DETECTED Final   Parainfluenza Virus 1 NOT DETECTED NOT DETECTED Final   Parainfluenza Virus 2 NOT DETECTED NOT DETECTED Final   Parainfluenza Virus 3 NOT DETECTED NOT DETECTED Final   Parainfluenza Virus 4 NOT DETECTED NOT DETECTED Final    Respiratory Syncytial Virus NOT DETECTED NOT DETECTED Final   Bordetella pertussis NOT DETECTED NOT DETECTED Final   Bordetella Parapertussis NOT DETECTED NOT DETECTED Final   Chlamydophila pneumoniae NOT DETECTED NOT DETECTED Final   Mycoplasma pneumoniae NOT DETECTED NOT DETECTED Final    Comment: Performed at [facility], [address], [place], [place] [address]    Studies/Results:  DG C-Arm 1-60 Min-No Report Result Date: 10/24/2023 Fluoroscopy was utilized by the requesting physician.  No radiographic interpretation.   CT CHEST ABDOMEN PELVIS WO CONTRAST Result Date: 10/24/2023 CLINICAL DATA:  Respiratory illness, nondiagnostic xray ( for a while ) Sweating at night. 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 RADIATION DOSE REDUCTION: This exam was performed according to the departmental dose-optimization program which includes automated exposure control, adjustment of the mA and/or kV according to patient size and/or use of iterative reconstruction technique. COMPARISON:  CT abdomen pelvis 08/27/2020 FINDINGS: CT CHEST FINDINGS Cardiovascular: Normal heart size. No significant pericardial effusion. The thoracic aorta is normal in caliber. Mild atherosclerotic plaque of the thoracic aorta. No coronary artery calcifications. Mediastinum/Nodes: No enlarged mediastinal, hilar, or axillary lymph nodes. Thyroid  gland, trachea, and esophagus demonstrate no significant findings. Lungs/Pleura: No focal consolidation. No pulmonary nodule. No pulmonary mass. No pleural effusion. No pneumothorax. Musculoskeletal: No chest wall abnormality. No suspicious lytic or blastic osseous lesions. No acute displaced fracture. CT ABDOMEN PELVIS FINDINGS Hepatobiliary: Chronic several pericentimeter and subcentimeter  hypodensities within the bilateral patent lobes-likely simple cyst. No gallstones, gallbladder wall thickening, or pericholecystic fluid. No biliary dilatation. Pancreas: No focal lesion. Normal pancreatic contour. No surrounding inflammatory changes. No main pancreatic ductal dilatation. Spleen: Normal in size without focal abnormality. Adrenals/Urinary Tract: No adrenal nodule bilaterally. Bilateral nephrolithiasis measuring up to 2 mm bilaterally. 6 mm calcified stone within the distal ureter with associated proximal mild hydroureteronephrosis. Associated urothelial thickening. Asymmetric right perinephric and periureteral fat stranding. No left ureterolithiasis.  No left hydronephrosis. The urinary bladder is unremarkable. Stomach/Bowel: Stomach is within normal limits. No evidence of bowel wall thickening or dilatation. Stool throughout the descending colon and rectosigmoid colon. Appendix appears normal. Vascular/Lymphatic: No abdominal aorta or iliac aneurysm. Mild atherosclerotic plaque of the aorta and its branches. No abdominal, pelvic, or inguinal lymphadenopathy. Reproductive: Atrophic uterus.  Bilateral adnexa are unremarkable. Other: No intraperitoneal free fluid. No intraperitoneal free gas. No organized fluid collection. Musculoskeletal: No abdominal wall hernia or abnormality. No suspicious lytic or blastic osseous lesions. No acute displaced fracture. IMPRESSION: 1. No acute intrathoracic abnormality. 2. Obstructive 6 mm distal ureterolithiasis with associated urothelial thickening-correlate for superimposed infection with urinalysis. 3. Nonobstructive bilateral nephrolithiasis measuring up to 2 mm. 4.  Aortic Atherosclerosis (ICD10-I70.0). Electronically Signed   By: Morgane  Naveau M.D.   On: 10/24/2023 18:24   DG Chest 2 View Result Date: 10/24/2023 CLINICAL DATA:  Fatigue, diaphoresis. EXAM: CHEST - 2 VIEW COMPARISON:  09/30/2012. FINDINGS: Trachea is midline. Heart size normal. Mild streaky  bibasilar scarring or atelectasis. There may be trace bilateral pleural effusions. IMPRESSION: Streaky bibasilar scarring or atelectasis with possible trace bilateral pleural effusions. Electronically Signed   By: Newell Eke M.D.   On: 10/24/2023 16:41    Assessment/Plan: Right ureteral calculus with UTI/AKI: -urine culture has resulted and okay to transition to oral antibiotics -BUN/Cr down trending -pt to be scheduled for outpatient ureteroscopy in 2-3 weeks   Valli Shank, MD Urology 10/26/2023, 10:51 AM

## 2023-10-27 DIAGNOSIS — A419 Sepsis, unspecified organism: Secondary | ICD-10-CM | POA: Diagnosis not present

## 2023-10-27 DIAGNOSIS — N179 Acute kidney failure, unspecified: Secondary | ICD-10-CM | POA: Diagnosis not present

## 2023-10-27 DIAGNOSIS — N200 Calculus of kidney: Secondary | ICD-10-CM | POA: Diagnosis not present

## 2023-10-27 DIAGNOSIS — R6520 Severe sepsis without septic shock: Secondary | ICD-10-CM | POA: Diagnosis not present

## 2023-10-27 LAB — CBC WITH DIFFERENTIAL/PLATELET
Abs Immature Granulocytes: 0.08 K/uL — ABNORMAL HIGH (ref 0.00–0.07)
Basophils Absolute: 0 K/uL (ref 0.0–0.1)
Basophils Relative: 0 %
Eosinophils Absolute: 0.1 K/uL (ref 0.0–0.5)
Eosinophils Relative: 1 %
HCT: 28.1 % — ABNORMAL LOW (ref 36.0–46.0)
Hemoglobin: 8.9 g/dL — ABNORMAL LOW (ref 12.0–15.0)
Immature Granulocytes: 1 %
Lymphocytes Relative: 18 %
Lymphs Abs: 1.9 K/uL (ref 0.7–4.0)
MCH: 25.1 pg — ABNORMAL LOW (ref 26.0–34.0)
MCHC: 31.7 g/dL (ref 30.0–36.0)
MCV: 79.4 fL — ABNORMAL LOW (ref 80.0–100.0)
Monocytes Absolute: 0.5 K/uL (ref 0.1–1.0)
Monocytes Relative: 5 %
Neutro Abs: 7.8 K/uL — ABNORMAL HIGH (ref 1.7–7.7)
Neutrophils Relative %: 75 %
Platelets: 182 K/uL (ref 150–400)
RBC: 3.54 MIL/uL — ABNORMAL LOW (ref 3.87–5.11)
RDW: 14 % (ref 11.5–15.5)
WBC: 10.4 K/uL (ref 4.0–10.5)
nRBC: 0 % (ref 0.0–0.2)

## 2023-10-27 MED ORDER — AMOXICILLIN 500 MG PO CAPS: 500.0000 mg | ORAL_CAPSULE | Freq: Three times a day (TID) | ORAL | 0 refills | Status: AC

## 2023-10-27 NOTE — Discharge Summary (Signed)
 Physician Discharge Summary   Patient: Victoria Strickland MRN: [id number] DOB: Sep 20, 1960  Admit date:     10/24/2023  Discharge date: 10/27/2023  Discharge Physician: Elgie Butter   PCP: Jarold Medici, MD   Recommendations at discharge:  Please follow up with PCP and urology as recommended .  Discharge Diagnoses: Principal Problem:   Severe sepsis (HCC) Active Problems:   Kidney stone   Urinary tract infection with hematuria   AKI (acute kidney injury) (HCC)  Resolved Problems:   * No resolved hospital problems. *  Hospital Course:  Victoria Strickland is a 63 y.o. female with medical history significant for hypertension, hyperlipidemia and prior nephrolithiasis being admitted to the hospital with severe sepsis.  CT of the chest abdomen pelvis was unremarkable except for obstructing right-sided 6 mm renal stone. Patient has been transferred ER to ER, and is about to undergo urgent right-sided ureteroscopy and stent placement.  Assessment and Plan:  Right Ureteral Calculus S/p right ureteral stent placement with Dr Elisabeth on 7/23. Urine cultures growing enterococcus, transitioned to oral amoxicillin  500 mg TID.Complete 10 day course.  Afebrile BP parameters are improving.  Wbc count wnl.  Pain control.    Acute Kidney injury:  Creatinine of 1.4 today.      Hyponatremia:  Improving. Sodium has improved to 134.    Normocytic anemia Hemoglobin stable around 9.    Leukocytosis Suspect from UTI     Thrombocytopenia Suspect from infection.  Resolved.    Estimated body mass index is 27.09 kg/m as calculated from the following:   Height as of this encounter: 5' 4 (1.626 m).   Weight as of this encounter: 71.6 kg.    Consultants: urology  Procedures performed:  Cystoscopy right ureteral stent placement right retrograde pyelography with interpretation    Disposition: Home Diet recommendation:  Discharge Diet Orders (From admission, onward)     Start     Ordered    10/27/23 0000  Diet - low sodium heart healthy        10/27/23 0825           Regular diet DISCHARGE MEDICATION: Allergies as of 10/27/2023       Reactions   Atorvastatin  Other (See Comments)   Memory loss        Medication List     STOP taking these medications    estradiol 0.1 MG/GM vaginal cream Commonly known as: ESTRACE   levofloxacin  250 MG tablet Commonly known as: LEVAQUIN    Repatha  SureClick 140 MG/ML Soaj Generic drug: Evolocumab        TAKE these medications    amoxicillin  500 MG capsule Commonly known as: AMOXIL  Take 1 capsule (500 mg total) by mouth every 8 (eight) hours for 10 days.   multivitamin Liqd Take 5 mLs by mouth daily.   spironolactone  25 MG tablet Commonly known as: ALDACTONE  TAKE 1 TABLET(25 MG) BY MOUTH DAILY        Discharge Exam: Filed Weights   10/24/23 2200 10/24/23 2333  Weight: 68.9 kg 71.6 kg   General exam: Appears calm and comfortable  Respiratory system: Clear to auscultation. Respiratory effort normal. Cardiovascular system: S1 & S2 heard, RRR. No JVD, murmurs, rubs, gallops or clicks. No pedal edema. Gastrointestinal system: Abdomen is nondistended, soft and nontender. No organomegaly or masses felt. Normal bowel sounds heard. Central nervous system: Alert and oriented. No focal neurological deficits. Extremities: Symmetric 5 x 5 power. Skin: No rashes, lesions or ulcers Psychiatry: Judgement and insight appear normal.  Mood & affect appropriate.    Condition at discharge: fair  The results of significant diagnostics from this hospitalization (including imaging, microbiology, ancillary and laboratory) are listed below for reference.   Imaging Studies: DG C-Arm 1-60 Min-No Report Result Date: 10/24/2023 Fluoroscopy was utilized by the requesting physician.  No radiographic interpretation.   CT CHEST ABDOMEN PELVIS WO CONTRAST Result Date: 10/24/2023 CLINICAL DATA:  Respiratory illness, nondiagnostic  xray ( for a while ) Sweating at night. 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 RADIATION DOSE REDUCTION: This exam was performed according to the departmental dose-optimization program which includes automated exposure control, adjustment of the mA and/or kV according to patient size and/or use of iterative reconstruction technique. COMPARISON:  CT abdomen pelvis 08/27/2020 FINDINGS: CT CHEST FINDINGS Cardiovascular: Normal heart size. No significant pericardial effusion. The thoracic aorta is normal in caliber. Mild atherosclerotic plaque of the thoracic aorta. No coronary artery calcifications. Mediastinum/Nodes: No enlarged mediastinal, hilar, or axillary lymph nodes. Thyroid  gland, trachea, and esophagus demonstrate no significant findings. Lungs/Pleura: No focal consolidation. No pulmonary nodule. No pulmonary mass. No pleural effusion. No pneumothorax. Musculoskeletal: No chest wall abnormality. No suspicious lytic or blastic osseous lesions. No acute displaced fracture. CT ABDOMEN PELVIS FINDINGS Hepatobiliary: Chronic several pericentimeter and subcentimeter hypodensities within the bilateral patent lobes-likely simple cyst. No gallstones, gallbladder wall thickening, or pericholecystic fluid. No biliary dilatation. Pancreas: No focal lesion. Normal pancreatic contour. No surrounding inflammatory changes. No main pancreatic ductal dilatation. Spleen: Normal in size without focal abnormality. Adrenals/Urinary Tract: No adrenal nodule bilaterally. Bilateral nephrolithiasis measuring up to 2 mm bilaterally. 6 mm calcified stone within the distal ureter with associated proximal mild hydroureteronephrosis. Associated urothelial thickening. Asymmetric right perinephric and periureteral fat stranding. No left ureterolithiasis.   No left hydronephrosis. The urinary bladder is unremarkable. Stomach/Bowel: Stomach is within normal limits. No evidence of bowel wall thickening or dilatation. Stool throughout the descending colon and rectosigmoid colon. Appendix appears normal. Vascular/Lymphatic: No abdominal aorta or iliac aneurysm. Mild atherosclerotic plaque of the aorta and its branches. No abdominal, pelvic, or inguinal lymphadenopathy. Reproductive: Atrophic uterus.  Bilateral adnexa are unremarkable. Other: No intraperitoneal free fluid. No intraperitoneal free gas. No organized fluid collection. Musculoskeletal: No abdominal wall hernia or abnormality. No suspicious lytic or blastic osseous lesions. No acute displaced fracture. IMPRESSION: 1. No acute intrathoracic abnormality. 2. Obstructive 6 mm distal ureterolithiasis with associated urothelial thickening-correlate for superimposed infection with urinalysis. 3. Nonobstructive bilateral nephrolithiasis measuring up to 2 mm. 4.  Aortic Atherosclerosis (ICD10-I70.0). Electronically Signed   By: Morgane  Naveau M.D.   On: 10/24/2023 18:24   DG Chest 2 View Result Date: 10/24/2023 CLINICAL DATA:  Fatigue, diaphoresis. EXAM: CHEST - 2 VIEW COMPARISON:  09/30/2012. FINDINGS: Trachea is midline. Heart size normal. Mild streaky bibasilar scarring or atelectasis. There may be trace bilateral pleural effusions. IMPRESSION: Streaky bibasilar scarring or atelectasis with possible trace bilateral pleural effusions. Electronically Signed   By: Newell Eke M.D.   On: 10/24/2023 16:41    Microbiology: Results for orders placed or performed during the hospital encounter of 10/24/23  Resp panel by RT-PCR (RSV, Flu A&B, Covid) Anterior Nasal Swab     Status: None   Collection Time: 10/24/23  4:24 PM   Specimen: Anterior Nasal Swab  Result Value Ref Range Status   SARS Coronavirus 2  by RT PCR NEGATIVE NEGATIVE Final    Comment: (NOTE) SARS-CoV-2 target nucleic acids are NOT  DETECTED.  The SARS-CoV-2 RNA is generally detectable in upper respiratory specimens during the acute phase of infection. The lowest concentration of SARS-CoV-2 viral copies this assay can detect is 138 copies/mL. A negative result does not preclude SARS-Cov-2 infection and should not be used as the sole basis for treatment or other patient management decisions. A negative result may occur with  improper specimen collection/handling, submission of specimen other than nasopharyngeal swab, presence of viral mutation(s) within the areas targeted by this assay, and inadequate number of viral copies(<138 copies/mL). A negative result must be combined with clinical observations, patient history, and epidemiological information. The expected result is Negative.  Fact Sheet for Patients:  BloggerCourse.com  Fact Sheet for Healthcare Providers:  SeriousBroker.it  This test is no t yet approved or cleared by the [place]  FDA and  has been authorized for detection and/or diagnosis of SARS-CoV-2 by FDA under an Emergency Use Authorization (EUA). This EUA will remain  in effect (meaning this test can be used) for the duration of the COVID-19 declaration under Section 564(b)(1) of the Act, 21 U.S.C.section 360bbb-3(b)(1), unless the authorization is terminated  or revoked sooner.       Influenza A by PCR NEGATIVE NEGATIVE Final   Influenza B by PCR NEGATIVE NEGATIVE Final    Comment: (NOTE) The Xpert Xpress SARS-CoV-2/FLU/RSV plus assay is intended as an aid in the diagnosis of influenza from Nasopharyngeal swab specimens and should not be used as a sole basis for treatment. Nasal washings and aspirates are unacceptable for Xpert Xpress SARS-CoV-2/FLU/RSV testing.  Fact Sheet for Patients: BloggerCourse.com  Fact Sheet for Healthcare Providers: SeriousBroker.it  This test is not yet  approved or cleared by the [place]  FDA and has been authorized for detection and/or diagnosis of SARS-CoV-2 by FDA under an Emergency Use Authorization (EUA). This EUA will remain in effect (meaning this test can be used) for the duration of the COVID-19 declaration under Section 564(b)(1) of the Act, 21 U.S.C. section 360bbb-3(b)(1), unless the authorization is terminated or revoked.     Resp Syncytial Virus by PCR NEGATIVE NEGATIVE Final    Comment: (NOTE) Fact Sheet for Patients: BloggerCourse.com  Fact Sheet for Healthcare Providers: SeriousBroker.it  This test is not yet approved or cleared by the [place]  FDA and has been authorized for detection and/or diagnosis of SARS-CoV-2 by FDA under an Emergency Use Authorization (EUA). This EUA will remain in effect (meaning this test can be used) for the duration of the COVID-19 declaration under Section 564(b)(1) of the Act, 21 U.S.C. section 360bbb-3(b)(1), unless the authorization is terminated or revoked.  Performed at [facility], [address], [place], [place] [address]   Blood culture (routine x 2)     Status: None (Preliminary result)   Collection Time: 10/24/23  5:09 PM   Specimen: BLOOD  Result Value Ref Range Status   Specimen Description   Final    BLOOD LEFT ANTECUBITAL Performed at [facility], [address] [place], [place] [address]    Special Requests   Final    BOTTLES DRAWN AEROBIC AND ANAEROBIC Blood Culture adequate volume Performed at [facility], [address], [place], [place] [address]    Culture   Final    NO GROWTH 1 DAY Performed at [facility], [address], [place], [place] [address]    Report Status PENDING  Incomplete  Blood culture (routine x 2)     Status: None (Preliminary result)   Collection Time: 10/24/23  5:14 PM   Specimen: BLOOD  Result Value Ref  Range Status   Specimen Description   Final    BLOOD RIGHT ANTECUBITAL Performed at [facility], [address], [place], [place] [address]    Special Requests   Final    BOTTLES DRAWN AEROBIC AND ANAEROBIC Blood Culture adequate volume Performed at [facility], [address], [place], [place] [address]    Culture   Final    NO GROWTH 1 DAY Performed at [facility], [address], [place], [place] [address]    Report Status PENDING  Incomplete  Urine Culture     Status: Abnormal   Collection Time: 10/24/23  6:15 PM   Specimen: Urine, Random  Result Value Ref Range Status   Specimen Description   Final    URINE, RANDOM Performed at [facility], [address] [place], [place] [address]    Special Requests   Final    NONE Reflexed from [id number] Performed at [facility], [address] [place], [place] [address]    Culture >=100,000 COLONIES/mL ENTEROCOCCUS FAECALIS (A)  Final   Report Status 10/26/2023 FINAL  Final   Organism ID, Bacteria ENTEROCOCCUS FAECALIS (A)  Final      Susceptibility   Enterococcus faecalis - MIC*    AMPICILLIN <=2 SENSITIVE Sensitive     NITROFURANTOIN <=16 SENSITIVE Sensitive     VANCOMYCIN  1 SENSITIVE Sensitive     * >=100,000 COLONIES/mL ENTEROCOCCUS FAECALIS  MRSA Next Gen by PCR, Nasal     Status: None   Collection Time: 10/24/23 11:38 PM   Specimen: Nasal Mucosa; Nasal Swab  Result Value Ref Range Status   MRSA by PCR Next Gen NOT DETECTED NOT DETECTED Final    Comment: (NOTE) The GeneXpert MRSA Assay (FDA approved for NASAL specimens only), is one component of a comprehensive MRSA colonization surveillance program. It is not intended to diagnose MRSA infection nor to guide or monitor treatment for MRSA infections. Test performance is not FDA approved in patients less than 41 years old. Performed at [facility], [address] [place], [place] [address]   Respiratory (~20 pathogens) panel by PCR     Status: None   Collection Time: 10/25/23  6:43 AM   Specimen: Nasopharyngeal Swab; Respiratory  Result Value Ref Range Status   Adenovirus NOT DETECTED NOT DETECTED Final   Coronavirus 229E NOT DETECTED NOT DETECTED Final    Comment: (NOTE) The Coronavirus on the Respiratory Panel, DOES NOT test for the novel  Coronavirus (2019 nCoV)    Coronavirus HKU1 NOT DETECTED NOT DETECTED Final   Coronavirus NL63 NOT DETECTED NOT DETECTED Final   Coronavirus OC43 NOT DETECTED NOT DETECTED Final   Metapneumovirus NOT DETECTED NOT DETECTED Final   Rhinovirus / Enterovirus NOT DETECTED NOT DETECTED Final   Influenza A NOT DETECTED NOT DETECTED Final   Influenza B NOT DETECTED NOT DETECTED Final   Parainfluenza Virus 1 NOT DETECTED NOT DETECTED Final   Parainfluenza Virus 2 NOT DETECTED NOT DETECTED Final   Parainfluenza Virus 3 NOT DETECTED NOT DETECTED Final   Parainfluenza Virus 4 NOT DETECTED NOT DETECTED Final   Respiratory Syncytial Virus NOT DETECTED NOT DETECTED Final   Bordetella pertussis NOT DETECTED NOT DETECTED Final   Bordetella Parapertussis NOT DETECTED NOT DETECTED Final   Chlamydophila pneumoniae  NOT DETECTED NOT DETECTED Final   Mycoplasma pneumoniae NOT DETECTED NOT DETECTED Final    Comment: Performed at [facility], [address], [place], [place] [address]    Labs: CBC: Recent Labs  Lab 10/24/23 1624 10/25/23 0009 10/25/23 0830 10/26/23 0412 10/27/23 0436  WBC 12.1* 7.7 6.0 13.2* 10.4  NEUTROABS 10.1*  --   --  11.5* 7.8*  HGB 9.3* 7.3* 9.1* 9.1* 8.9*  HCT 27.9* 23.8* 29.5* 28.5* 28.1*  MCV 78.4* 81.8 84.5 80.7 79.4*  PLT 154 104* 107* 162 182   Basic Metabolic Panel: Recent Labs  Lab 10/24/23 1624 10/25/23 0328 10/26/23 0412  NA 127* 132* 134*  K 4.2 4.3 4.0  CL 91* 103 104  CO2 20* 20* 23  GLUCOSE 135* 152* 141*  BUN 29* 23 26*  CREATININE 2.44* 1.88* 1.44*   CALCIUM  9.4 8.3* 8.8*   Liver Function Tests: Recent Labs  Lab 10/24/23 1624  AST 64*  ALT 38  ALKPHOS 56  BILITOT 1.2  PROT 7.7  ALBUMIN 3.9   CBG: No results for input(s): GLUCAP in the last 168 hours.  Discharge time spent: 38 minutes.   Signed: Dianca Owensby, MD [facility]

## 2023-10-27 NOTE — Progress Notes (Signed)
 RN read and reviewed discharge instructions. Pt verbalized understanding. Pt escorted to the main entrance with spouse to personal vehicle.

## 2023-10-29 ENCOUNTER — Telehealth: Payer: Self-pay | Admitting: *Deleted

## 2023-10-29 NOTE — Transitions of Care (Post Inpatient/ED Visit) (Signed)
   10/29/2023  Name: Victoria Strickland MRN: [id number] DOB: [birth year]  Today's TOC FU Call Status: Today's TOC FU Call Status:: Unsuccessful Call (1st Attempt) Unsuccessful Call (1st Attempt) Date: 10/29/23  Attempted to reach the patient regarding the most recent Inpatient/ED visit.  Follow Up Plan: Additional outreach attempts will be made to reach the patient to complete the Transitions of Care (Post Inpatient/ED visit) call.   Andrea Dimes RN, BSN [place]  [facility] RN Care Manager [phone]

## 2023-10-30 ENCOUNTER — Other Ambulatory Visit: Payer: Self-pay | Admitting: Urology

## 2023-10-30 ENCOUNTER — Telehealth: Payer: Self-pay | Admitting: *Deleted

## 2023-10-30 LAB — CULTURE, BLOOD (ROUTINE X 2)
Culture: NO GROWTH
Culture: NO GROWTH
Special Requests: ADEQUATE
Special Requests: ADEQUATE

## 2023-10-30 NOTE — Transitions of Care (Post Inpatient/ED Visit) (Signed)
   10/30/2023  Name: Victoria Strickland MRN: [id number] DOB: 1960/07/19  Today's TOC FU Call Status: Today's TOC FU Call Status:: Successful TOC FU Call Completed TOC FU Call Complete Date: 10/30/23 Patient's Name and Date of Birth confirmed.  Transition Care Management Follow-up Telephone Call Date of Discharge: 10/27/23 Discharge Facility: [facility]) Type of Discharge: Inpatient Admission Primary Inpatient Discharge Diagnosis:: Severe sepsis How have you been since you were released from the hospital?: Same Any questions or concerns?: No  Items Reviewed: Did you receive and understand the discharge instructions provided?: Yes Medications obtained,verified, and reconciled?: Yes (Medications Reviewed) Any new allergies since your discharge?: No Dietary orders reviewed?: Yes Type of Diet Ordered:: low sodium heart healthy Do you have support at home?: Yes People in Home [RPT]: spouse Name of Support/Comfort Primary Source: Harvey/Spouse  Medications Reviewed Today: Medications Reviewed Today     Reviewed by Lucky Andrea LABOR, RN (Registered Nurse) on 10/30/23 at 1143  Med List Status: <None>   Medication Order Taking? Sig Documenting Provider Last Dose Status Informant  amoxicillin  (AMOXIL ) 500 MG capsule [id number] Yes Take 1 capsule (500 mg total) by mouth every 8 (eight) hours for 10 days. Akula, Vijaya, MD  Active   Multiple Vitamin (MULTIVITAMIN) LIQD [id number] Yes Take 5 mLs by mouth daily. [provider]  Active Self, Pharmacy Records  spironolactone  (ALDACTONE ) 25 MG tablet [id number] Yes TAKE 1 TABLET(25 MG) BY MOUTH DAILY Raford Riggs, MD  Active Self, Pharmacy Records            Home Care and Equipment/Supplies: Were Home Health Services Ordered?: No Any new equipment or medical supplies ordered?: No  Functional Questionnaire: Do you need assistance with bathing/showering or dressing?: No Do you need assistance with meal preparation?: No Do  you need assistance with eating?: No Do you have difficulty maintaining continence: No Do you need assistance with getting out of bed/getting out of a chair/moving?: No Do you have difficulty managing or taking your medications?: No  Follow up appointments reviewed: PCP Follow-up appointment confirmed?: No (Patient will call and schedule PCP hospital follow up) MD Provider Line [phone] Given: No Specialist Hospital Follow-up appointment confirmed?: No Reason Specialist Follow-Up Not Confirmed: Patient has Specialist Provider Number and will Call for Appointment [facility] provided patient with [facility] [phone]. Patient will call and schedule follow up) Do you need transportation to your follow-up appointment?: No Do you understand care options if your condition(s) worsen?: Yes-patient verbalized understanding  SDOH Interventions Today    Flowsheet Row Most Recent Value  SDOH Interventions   Food Insecurity Interventions Intervention Not Indicated  Housing Interventions Intervention Not Indicated  Transportation Interventions Intervention Not Indicated  Utilities Interventions Intervention Not Indicated   Patient is back to work and had limited time to complete this telephone outreach.   Andrea Lucky RN, BSN [place]  [facility] RN Care Manager [phone]

## 2023-10-31 ENCOUNTER — Encounter: Payer: Self-pay | Admitting: Internal Medicine

## 2023-11-05 NOTE — Patient Instructions (Signed)
 SURGICAL WAITING ROOM VISITATION  Patients having surgery or a procedure may have no more than 2 support people in the waiting area - these visitors may rotate.    Children under the age of 23 must have an adult with them who is not the patient.  Visitors with respiratory illnesses are discouraged from visiting and should remain at home.  If the patient needs to stay at the hospital during part of their recovery, the visitor guidelines for inpatient rooms apply. Pre-op nurse will coordinate an appropriate time for 1 support person to accompany patient in pre-op.  This support person may not rotate.    Please refer to the [facility] website for the visitor guidelines for Inpatients (after your surgery is over and you are in a regular room).       Your procedure is scheduled on:  11/20/2023    Report to [facility] Main Entrance    Report to admitting at   0515 AM   Call this number if you have problems the morning of surgery [phone]   Do not eat food  or drink liquids :After Midnight.                If you have questions, please contact your surgeon's office.      Oral Hygiene is also important to reduce your risk of infection.                                    Remember - BRUSH YOUR TEETH THE MORNING OF SURGERY WITH YOUR REGULAR TOOTHPASTE  DENTURES WILL BE REMOVED PRIOR TO SURGERY PLEASE DO NOT APPLY Poly grip OR ADHESIVES!!!   Do NOT smoke after Midnight   Stop all vitamins and herbal supplements 7 days before surgery.   Take these medicines the morning of surgery with A SIP OF WATER: none   DO NOT TAKE ANY ORAL DIABETIC MEDICATIONS DAY OF YOUR SURGERY  Bring CPAP mask and tubing day of surgery.                              You may not have any metal on your body including hair pins, jewelry, and body piercing             Do not wear make-up, lotions, powders, perfumes/cologne, or deodorant  Do not wear nail polish including gel and S&S,  artificial/acrylic nails, or any other type of covering on natural nails including finger and toenails. If you have artificial nails, gel coating, etc. that needs to be removed by a nail salon please have this removed prior to surgery or surgery may need to be canceled/ delayed if the surgeon/ anesthesia feels like they are unable to be safely monitored.   Do not shave  48 hours prior to surgery.               Men may shave face and neck.   Do not bring valuables to the hospital. [facility] IS NOT             RESPONSIBLE   FOR VALUABLES.   Contacts, glasses, dentures or bridgework may not be worn into surgery.   Bring small overnight bag day of surgery.   DO NOT BRING YOUR HOME MEDICATIONS TO THE HOSPITAL. PHARMACY WILL DISPENSE MEDICATIONS LISTED ON YOUR MEDICATION LIST TO YOU DURING YOUR ADMISSION IN  THE HOSPITAL!    Patients discharged on the day of surgery will not be allowed to drive home.  Someone NEEDS to stay with you for the first 24 hours after anesthesia.   Special Instructions: Bring a copy of your healthcare power of attorney and living will documents the day of surgery if you haven't scanned them before.              Please read over the following fact sheets you were given: IF YOU HAVE QUESTIONS ABOUT YOUR PRE-OP INSTRUCTIONS PLEASE CALL [phone].   If you received a COVID test during your pre-op visit  it is requested that you wear a mask when out in public, stay away from anyone that may not be feeling well and notify your surgeon if you develop symptoms. If you test positive for Covid or have been in contact with anyone that has tested positive in the last 10 days please notify you surgeon.    [place] - Preparing for Surgery Before surgery, you can play an important role.  Because skin is not sterile, your skin needs to be as free of germs as possible.  You can reduce the number of germs on your skin by washing with CHG (chlorahexidine gluconate) soap before surgery.   CHG is an antiseptic cleaner which kills germs and bonds with the skin to continue killing germs even after washing. Please DO NOT use if you have an allergy to CHG or antibacterial soaps.  If your skin becomes reddened/irritated stop using the CHG and inform your nurse when you arrive at Short Stay. Do not shave (including legs and underarms) for at least 48 hours prior to the first CHG shower.  You may shave your face/neck. Please follow these instructions carefully:  1.  Shower with CHG Soap the night before surgery and the  morning of Surgery.  2.  If you choose to wash your hair, wash your hair first as usual with your  normal  shampoo.  3.  After you shampoo, rinse your hair and body thoroughly to remove the  shampoo.                           4.  Use CHG as you would any other liquid soap.  You can apply chg directly  to the skin and wash                       Gently with a scrungie or clean washcloth.  5.  Apply the CHG Soap to your body ONLY FROM THE NECK DOWN.   Do not use on face/ open                           Wound or open sores. Avoid contact with eyes, ears mouth and genitals (private parts).                       Wash face,  Genitals (private parts) with your normal soap.             6.  Wash thoroughly, paying special attention to the area where your surgery  will be performed.  7.  Thoroughly rinse your body with warm water from the neck down.  8.  DO NOT shower/wash with your normal soap after using and rinsing off  the CHG Soap.  9.  Pat yourself dry with a clean towel.            10.  Wear clean pajamas.            11.  Place clean sheets on your bed the night of your first shower and do not  sleep with pets. Day of Surgery : Do not apply any lotions/deodorants the morning of surgery.  Please wear clean clothes to the hospital/surgery center.  FAILURE TO FOLLOW THESE INSTRUCTIONS MAY RESULT IN THE CANCELLATION OF YOUR SURGERY PATIENT  SIGNATURE_________________________________  NURSE SIGNATURE__________________________________  ________________________________________________________________________

## 2023-11-05 NOTE — Progress Notes (Signed)
 Anesthesia Review:  PCP: Cardiologist :  PPM/ ICD: Device Orders: Rep Notified:  Chest x-ray : 10/24/23- 2 view  CT Chest- 10/24/23  EKG : 10/25/23  CT Card- 09/09/22  Echo : Stress test: Cardiac Cath :   Activity level:  Sleep Study/ CPAP : Fasting Blood Sugar :      / Checks Blood Sugar -- times a day:    Blood Thinner/ Instructions /Last Dose: ASA / Instructions/ Last Dose :    10/27/23- cbc/diff and BMP-  10/24/23- In ED and Admitted [facility] on 10/27/23 with AKI

## 2023-11-06 ENCOUNTER — Encounter: Payer: Self-pay | Admitting: Internal Medicine

## 2023-11-06 ENCOUNTER — Ambulatory Visit (INDEPENDENT_AMBULATORY_CARE_PROVIDER_SITE_OTHER): Admitting: Internal Medicine

## 2023-11-06 ENCOUNTER — Inpatient Hospital Stay: Admitting: Internal Medicine

## 2023-11-06 VITALS — BP 122/80 | HR 74 | Temp 99.2°F | Ht 64.0 in | Wt 154.0 lb

## 2023-11-06 DIAGNOSIS — N201 Calculus of ureter: Secondary | ICD-10-CM | POA: Diagnosis not present

## 2023-11-06 DIAGNOSIS — D649 Anemia, unspecified: Secondary | ICD-10-CM

## 2023-11-06 DIAGNOSIS — N390 Urinary tract infection, site not specified: Secondary | ICD-10-CM | POA: Diagnosis not present

## 2023-11-06 DIAGNOSIS — R319 Hematuria, unspecified: Secondary | ICD-10-CM

## 2023-11-06 DIAGNOSIS — R6520 Severe sepsis without septic shock: Secondary | ICD-10-CM

## 2023-11-06 DIAGNOSIS — A419 Sepsis, unspecified organism: Secondary | ICD-10-CM

## 2023-11-06 DIAGNOSIS — N179 Acute kidney failure, unspecified: Secondary | ICD-10-CM

## 2023-11-06 DIAGNOSIS — D72829 Elevated white blood cell count, unspecified: Secondary | ICD-10-CM

## 2023-11-06 DIAGNOSIS — I10 Essential (primary) hypertension: Secondary | ICD-10-CM

## 2023-11-06 DIAGNOSIS — R21 Rash and other nonspecific skin eruption: Secondary | ICD-10-CM

## 2023-11-06 DIAGNOSIS — N200 Calculus of kidney: Secondary | ICD-10-CM

## 2023-11-06 NOTE — Progress Notes (Signed)
 Victoria Strickland, CMA,acting as a Neurosurgeon for Victoria LOISE Slocumb, MD.,have documented all relevant documentation on the behalf of Victoria LOISE Slocumb, MD,as directed by  Victoria LOISE Slocumb, MD while in the presence of Victoria LOISE Slocumb, MD.  Subjective:  Patient ID: Victoria Strickland , female    DOB: [birth year] , 63 y.o.   MRN: [id number]  Chief Complaint  Patient presents with   hospital f/u    Follow up Hospitalization  Patient was admitted to [facility] on 10/24/23 and discharged on 10/27/23. She was treated for AKI,severe sepsis, and UTI. Treatment for this included IV fluids/abx. Telephone follow up was done on 10/30/23 She reports good compliance with treatment.   -------------------------------------------------------------------------    Patient presents today for a hospital f/u. Patient reports she is feeling well. She doesn't have any questions.    HPI Discussed the use of AI scribe software for clinical note transcription with the patient, who gave verbal consent to proceed.  History of Present Illness Victoria Strickland is a 63 year old female who presents for follow-up after hospitalization for sepsis and kidney stone management.  She was admitted to the hospital on July 23rd and discharged on July 26th with a diagnosis of sepsis and a right-sided kidney stone. A stent was placed to maintain ureteral patency, and she is scheduled for stone removal on August 19th.  She was discharged with a prescription for amoxicillin , taken three times daily. Since starting the antibiotics, she has developed rashes on her body but denies diarrhea. She is not sure if the rash is due to the antibiotics.  Prior to her hospital admission, she felt dehydrated and lethargic for several days, with significant fatigue but no fever or chills.  She was told that her body temperature was very low during her hospital stay and that warming measures were used. She feels much stronger now compared to the day of  discharge.  She has been experiencing hematuria since the hospitalization, but not hematochezia. She continues to experience nocturia, urinating approximately every two hours at night. She takes her blood pressure medication, which includes a diuretic, in the morning.  She returned to work the Monday following her discharge. She has been hydrating with water and Gatorade and reports her appetite is good.   Hypertension This is a chronic problem. The current episode started more than 1 year ago. The problem has been gradually improving since onset. The problem is controlled. Pertinent negatives include no blurred vision. Risk factors for coronary artery disease include post-menopausal state. The current treatment provides moderate improvement. There is no history of kidney disease or CAD/MI.     Past Medical History:  Diagnosis Date   COVID 05/2020   mild symptoms x few days all symptoms resolved   Essential hypertension, benign    Hematuria 08/27/2020   pt in er, issue resolved as of 10-07-2020   History of kidney stones    Hot flashes 10/07/2020   Hyperlipidemia    Right renal stone 10/07/2020   Wears dentures 10/07/2020   upper   Wears glasses 10/07/2020     Family History  Problem Relation Age of Onset   Fibroids Mother    Cancer Mother        unknown   Aneurysm Father    Heart disease Maternal Grandmother    Breast cancer Cousin        maternal side   Cancer Other        maternal   Breast cancer  Other    Stroke Other      Current Outpatient Medications:    Ascorbic Acid (VITAMIN C PO), Take 1 Dose by mouth in the morning. *liquid*, Disp: , Rfl:    Black Cohosh (REMIFEMIN MENOPAUSE PO), Take 1 tablet by mouth in the morning and at bedtime., Disp: , Rfl:    Cholecalciferol (VITAMIN D -3 PO), Take 1 tablet by mouth in the morning., Disp: , Rfl:    Cyanocobalamin (VITAMIN B-12 PO), Take 5 mLs by mouth at bedtime. *liquid*, Disp: , Rfl:    Multiple Vitamin (MULTIVITAMIN)  LIQD, Take 5 mLs by mouth daily., Disp: , Rfl:    spironolactone  (ALDACTONE ) 25 MG tablet, TAKE 1 TABLET(25 MG) BY MOUTH DAILY, Disp: 90 tablet, Rfl: 1   valACYclovir (VALTREX) 1000 MG tablet, Take 1,000 mg by mouth 2 (two) times daily., Disp: , Rfl:    Allergies  Allergen Reactions   Atorvastatin  Other (See Comments)    Memory loss     Review of Systems  Constitutional: Negative.   Eyes:  Negative for blurred vision.  Respiratory: Negative.    Cardiovascular: Negative.   Gastrointestinal: Negative.   Neurological: Negative.   Psychiatric/Behavioral: Negative.       Today's Vitals   11/06/23 1600  BP: 122/80  Pulse: 74  Temp: 99.2 F (37.3 C)  TempSrc: Oral  Weight: 154 lb (69.9 kg)  Height: 5' 4 (1.626 m)  PainSc: 0-No pain   Body mass index is 26.43 kg/m.  Wt Readings from Last 3 Encounters:  11/06/23 154 lb (69.9 kg)  10/24/23 157 lb 13.6 oz (71.6 kg)  10/02/23 151 lb 12.8 oz (68.9 kg)    The ASCVD Risk score (Arnett DK, et al., 2019) failed to calculate for the following reasons:   The valid total cholesterol range is 130 to 320 mg/dL  Objective:  Physical Exam Vitals and nursing note reviewed.  Constitutional:      Appearance: Normal appearance.  HENT:     Head: Normocephalic and atraumatic.  Eyes:     Extraocular Movements: Extraocular movements intact.  Cardiovascular:     Rate and Rhythm: Normal rate and regular rhythm.     Heart sounds: Normal heart sounds.  Pulmonary:     Effort: Pulmonary effort is normal.     Breath sounds: Normal breath sounds.  Musculoskeletal:     Cervical back: Normal range of motion.  Skin:    General: Skin is warm.  Neurological:     General: No focal deficit present.     Mental Status: She is alert.  Psychiatric:        Mood and Affect: Mood normal.        Behavior: Behavior normal.         Assessment And Plan:  Severe sepsis (HCC) Assessment & Plan: TCM PERFORMED. A MEMBER OF THE CLINICAL TEAM SPOKE WITH  THE PATIENT UPON DISCHARGE. DISCHARGE SUMMARY WAS REVIEWED IN FULL DETAIL DURING THE VISIT. MEDS RECONCILED AND COMPARED TO DISCHARGE MEDS. MEDICATION LIST WAS UPDATED AND REVIEWED WITH THE PATIENT. GREATER THAN 50% FACE TO FACE TIME WAS SPENT IN COUNSELING AND COORDINATION OF CARE. ALL QUESTIONS WERE ANSWERED TO THE SATISFACTION OF THE PATIENT.  Her symptoms have improved significantly. She is encouraged to stay well hydrated.   Orders: -     CBC with Differential/Platelet  Right ureteral calculus Assessment & Plan: Sepsis due to UTI with right ureteral stone. Stent placed for ureteral patency. Discharged on amoxicillin . No fever, but chills noted  prior to hospitalization. Hypothermia, tachycardia, leukocytosis, and hypotension indicated infection and dehydration. Stone removal scheduled for the August 19th. - Repeat sodium and kidney function tests. - Check white blood cell count. - Advise hydration with water-dense fruits and soups. - Continue antibiotics. - Coordinate with urology for stone removal on the 19th.   Urinary tract infection with hematuria, site unspecified Assessment & Plan: S/p right ureteral stent placement with Dr Elisabeth on 7/23. Urine cultures grew Enterococcus, transitioned to oral amoxicillin  500 mg TID.   AKI (acute kidney injury) (HCC) Assessment & Plan: Acute kidney injury due to dehydration. Hydrating with Gatorade and water-dense foods. - Repeat kidney function tests. - Advise against NSAIDs; recommend Tylenol  if needed. - Encourage hydration with water-dense fruits and soups.  Orders: -     BMP8+EGFR  Essential hypertension, benign Assessment & Plan: Hypertension management complicated by dehydration and acute kidney injury. On diuretic for blood pressure, may need adjustment if kidney function does not improve. - Monitor blood pressure. - Consider changing blood pressure medication if kidney function does not improve. - Advise on hydration to prevent  further dehydration.   Anemia, unspecified type Assessment & Plan: Anemia with low blood count. No blood in stools, hematuria likely related to kidney stone. - Check blood count and iron levels.  Orders: -     Iron, TIBC and Ferritin Panel  Rash Assessment & Plan: Rash possibly due to antibiotics. Reports small rashes since starting antibiotics. - No distinct rash noted on exam    Return if symptoms worsen or fail to improve.  Patient was given opportunity to ask questions. Patient verbalized understanding of the plan and was able to repeat key elements of the plan. All questions were answered to their satisfaction.   I, Victoria LOISE Slocumb, MD, have reviewed all documentation for this visit. The documentation on 11/06/23 for the exam, diagnosis, procedures, and orders are all accurate and complete.   IF YOU HAVE BEEN REFERRED TO A SPECIALIST, IT MAY TAKE 1-2 WEEKS TO SCHEDULE/PROCESS THE REFERRAL. IF YOU HAVE NOT HEARD FROM US /SPECIALIST IN TWO WEEKS, PLEASE GIVE US  A CALL AT [phone] X [phone].

## 2023-11-07 ENCOUNTER — Other Ambulatory Visit: Payer: Self-pay

## 2023-11-07 ENCOUNTER — Encounter (HOSPITAL_COMMUNITY): Payer: Self-pay

## 2023-11-07 ENCOUNTER — Encounter (HOSPITAL_COMMUNITY)
Admission: RE | Admit: 2023-11-07 | Discharge: 2023-11-07 | Disposition: A | Discharge status: Home / Self Care | Source: Ambulatory Visit | Attending: Urology | Admitting: Urology

## 2023-11-07 DIAGNOSIS — Z01818 Encounter for other preprocedural examination: Secondary | ICD-10-CM

## 2023-11-07 HISTORY — DX: Personal history of urinary calculi: Z87.442

## 2023-11-07 LAB — BMP8+EGFR
BUN/Creatinine Ratio: 11 — ABNORMAL LOW (ref 12–28)
BUN: 12 mg/dL (ref 8–27)
CO2: 22 mmol/L (ref 20–29)
Calcium: 9.6 mg/dL (ref 8.7–10.3)
Chloride: 99 mmol/L (ref 96–106)
Creatinine, Ser: 1.11 mg/dL — ABNORMAL HIGH (ref 0.57–1.00)
Glucose: 87 mg/dL (ref 70–99)
Potassium: 3.9 mmol/L (ref 3.5–5.2)
Sodium: 137 mmol/L (ref 134–144)
eGFR: 56 mL/min/1.73 — ABNORMAL LOW (ref >59)

## 2023-11-07 LAB — CBC WITH DIFFERENTIAL/PLATELET
Basophils Absolute: 0 x10E3/uL (ref 0.0–0.2)
Basos: 0 %
EOS (ABSOLUTE): 0.1 x10E3/uL (ref 0.0–0.4)
Eos: 1 %
Hematocrit: 29.8 % — ABNORMAL LOW (ref 34.0–46.6)
Hemoglobin: 9 g/dL — ABNORMAL LOW (ref 11.1–15.9)
Immature Grans (Abs): 0 x10E3/uL (ref 0.0–0.1)
Immature Granulocytes: 0 %
Lymphocytes Absolute: 1.7 x10E3/uL (ref 0.7–3.1)
Lymphs: 17 %
MCH: 25.2 pg — ABNORMAL LOW (ref 26.6–33.0)
MCHC: 30.2 g/dL — ABNORMAL LOW (ref 31.5–35.7)
MCV: 84 fL (ref 79–97)
Monocytes Absolute: 0.3 x10E3/uL (ref 0.1–0.9)
Monocytes: 4 %
Neutrophils Absolute: 7.6 x10E3/uL — ABNORMAL HIGH (ref 1.4–7.0)
Neutrophils: 78 %
Platelets: 288 x10E3/uL (ref 150–450)
RBC: 3.57 x10E6/uL — ABNORMAL LOW (ref 3.77–5.28)
RDW: 13.8 % (ref 11.7–15.4)
WBC: 9.8 x10E3/uL (ref 3.4–10.8)

## 2023-11-07 LAB — IRON,TIBC AND FERRITIN PANEL
Ferritin: 616 ng/mL — ABNORMAL HIGH (ref 15–150)
Iron Saturation: 8 % — CL (ref 15–55)
Iron: 23 ug/dL — ABNORMAL LOW (ref 27–139)
Total Iron Binding Capacity: 298 ug/dL (ref 250–450)
UIBC: 275 ug/dL (ref 118–369)

## 2023-11-08 ENCOUNTER — Ambulatory Visit: Admitting: Internal Medicine

## 2023-11-12 ENCOUNTER — Ambulatory Visit: Payer: Self-pay | Admitting: Internal Medicine

## 2023-11-17 DIAGNOSIS — R21 Rash and other nonspecific skin eruption: Secondary | ICD-10-CM | POA: Insufficient documentation

## 2023-11-17 DIAGNOSIS — D649 Anemia, unspecified: Secondary | ICD-10-CM | POA: Insufficient documentation

## 2023-11-17 NOTE — Assessment & Plan Note (Signed)
 Acute kidney injury due to dehydration. Hydrating with Gatorade and water-dense foods. - Repeat kidney function tests. - Advise against NSAIDs; recommend Tylenol  if needed. - Encourage hydration with water-dense fruits and soups.

## 2023-11-17 NOTE — Assessment & Plan Note (Signed)
 TCM PERFORMED. A MEMBER OF THE CLINICAL TEAM SPOKE WITH THE PATIENT UPON DISCHARGE. DISCHARGE SUMMARY WAS REVIEWED IN FULL DETAIL DURING THE VISIT. MEDS RECONCILED AND COMPARED TO DISCHARGE MEDS. MEDICATION LIST WAS UPDATED AND REVIEWED WITH THE PATIENT. GREATER THAN 50% FACE TO FACE TIME WAS SPENT IN COUNSELING AND COORDINATION OF CARE. ALL QUESTIONS WERE ANSWERED TO THE SATISFACTION OF THE PATIENT.  Her symptoms have improved significantly. She is encouraged to stay well hydrated.

## 2023-11-17 NOTE — Assessment & Plan Note (Signed)
 Sepsis due to UTI with right ureteral stone. Stent placed for ureteral patency. Discharged on amoxicillin . No fever, but chills noted prior to hospitalization. Hypothermia, tachycardia, leukocytosis, and hypotension indicated infection and dehydration. Stone removal scheduled for the August 19th. - Repeat sodium and kidney function tests. - Check white blood cell count. - Advise hydration with water-dense fruits and soups. - Continue antibiotics. - Coordinate with urology for stone removal on the 19th.

## 2023-11-17 NOTE — Assessment & Plan Note (Signed)
 Rash possibly due to antibiotics. Reports small rashes since starting antibiotics. - No distinct rash noted on exam

## 2023-11-17 NOTE — Assessment & Plan Note (Signed)
 Anemia with low blood count. No blood in stools, hematuria likely related to kidney stone. - Check blood count and iron levels.

## 2023-11-17 NOTE — Assessment & Plan Note (Signed)
 Hypertension management complicated by dehydration and acute kidney injury. On diuretic for blood pressure, may need adjustment if kidney function does not improve. - Monitor blood pressure. - Consider changing blood pressure medication if kidney function does not improve. - Advise on hydration to prevent further dehydration.

## 2023-11-17 NOTE — Assessment & Plan Note (Signed)
 S/p right ureteral stent placement with Dr Elisabeth on 7/23. Urine cultures grew Enterococcus, transitioned to oral amoxicillin  500 mg TID.

## 2023-11-20 ENCOUNTER — Ambulatory Visit (HOSPITAL_BASED_OUTPATIENT_CLINIC_OR_DEPARTMENT_OTHER): Admitting: Anesthesiology

## 2023-11-20 ENCOUNTER — Other Ambulatory Visit: Payer: Self-pay

## 2023-11-20 ENCOUNTER — Ambulatory Visit (HOSPITAL_COMMUNITY)
Admission: RE | Admit: 2023-11-20 | Discharge: 2023-11-20 | Disposition: A | Discharge status: Home / Self Care | Source: Ambulatory Visit | Attending: Urology | Admitting: Urology

## 2023-11-20 ENCOUNTER — Encounter (HOSPITAL_COMMUNITY): Payer: Self-pay | Admitting: Urology

## 2023-11-20 ENCOUNTER — Encounter (HOSPITAL_COMMUNITY)
Admission: RE | Disposition: A | Discharge status: Home / Self Care | Payer: Self-pay | Source: Ambulatory Visit | Attending: Urology

## 2023-11-20 ENCOUNTER — Ambulatory Visit (HOSPITAL_COMMUNITY)

## 2023-11-20 ENCOUNTER — Ambulatory Visit (HOSPITAL_COMMUNITY): Payer: Self-pay | Admitting: Physician Assistant

## 2023-11-20 DIAGNOSIS — E7800 Pure hypercholesterolemia, unspecified: Secondary | ICD-10-CM

## 2023-11-20 DIAGNOSIS — I10 Essential (primary) hypertension: Secondary | ICD-10-CM | POA: Diagnosis not present

## 2023-11-20 DIAGNOSIS — N201 Calculus of ureter: Secondary | ICD-10-CM

## 2023-11-20 DIAGNOSIS — Z01818 Encounter for other preprocedural examination: Secondary | ICD-10-CM

## 2023-11-20 LAB — CBC
HCT: 32.9 % — ABNORMAL LOW (ref 36.0–46.0)
Hemoglobin: 9.4 g/dL — ABNORMAL LOW (ref 12.0–15.0)
MCH: 25.3 pg — ABNORMAL LOW (ref 26.0–34.0)
MCHC: 28.6 g/dL — ABNORMAL LOW (ref 30.0–36.0)
MCV: 88.7 fL (ref 80.0–100.0)
Platelets: 312 K/uL (ref 150–400)
RBC: 3.71 MIL/uL — ABNORMAL LOW (ref 3.87–5.11)
RDW: 15.3 % (ref 11.5–15.5)
WBC: 5.6 K/uL (ref 4.0–10.5)
nRBC: 0 % (ref 0.0–0.2)

## 2023-11-20 SURGERY — CYSTOSCOPY/URETEROSCOPY/HOLMIUM LASER/STENT PLACEMENT
Anesthesia: General | Site: Ureter | Laterality: Right

## 2023-11-20 MED ORDER — DEXAMETHASONE SODIUM PHOSPHATE 10 MG/ML IJ SOLN
INTRAMUSCULAR | Status: AC
  Filled 2023-11-20: qty 1

## 2023-11-20 MED ORDER — DEXAMETHASONE SODIUM PHOSPHATE 10 MG/ML IJ SOLN
INTRAMUSCULAR | Status: DC | PRN
Start: 2023-11-20 — End: 2023-11-20
  Administered 2023-11-20: 10 mg via INTRAVENOUS

## 2023-11-20 MED ORDER — LACTATED RINGERS IV SOLN: INTRAVENOUS | Status: DC

## 2023-11-20 MED ORDER — PROPOFOL 10 MG/ML IV BOLUS
INTRAVENOUS | Status: AC
  Filled 2023-11-20: qty 20

## 2023-11-20 MED ORDER — LIDOCAINE HCL (PF) 2 % IJ SOLN
INTRAMUSCULAR | Status: AC
  Filled 2023-11-20: qty 5

## 2023-11-20 MED ORDER — CEFAZOLIN SODIUM-DEXTROSE 2-4 GM/100ML-% IV SOLN
2.0000 g | INTRAVENOUS | Status: AC
  Administered 2023-11-20: 2 g via INTRAVENOUS
  Filled 2023-11-20: qty 100

## 2023-11-20 MED ORDER — ONDANSETRON HCL 4 MG/2ML IJ SOLN
INTRAMUSCULAR | Status: DC | PRN
  Administered 2023-11-20: 4 mg via INTRAVENOUS

## 2023-11-20 MED ORDER — TRAMADOL HCL 50 MG PO TABS: 50.0000 mg | ORAL_TABLET | Freq: Four times a day (QID) | ORAL | 0 refills | Status: DC | PRN

## 2023-11-20 MED ORDER — LIDOCAINE HCL (PF) 2 % IJ SOLN
INTRAMUSCULAR | Status: DC | PRN
  Administered 2023-11-20: 40 mg via INTRADERMAL

## 2023-11-20 MED ORDER — ACETAMINOPHEN 10 MG/ML IV SOLN: 1000.0000 mg | Freq: Once | INTRAVENOUS | Status: DC | PRN

## 2023-11-20 MED ORDER — FENTANYL CITRATE (PF) 100 MCG/2ML IJ SOLN
INTRAMUSCULAR | Status: AC
  Filled 2023-11-20: qty 2

## 2023-11-20 MED ORDER — SODIUM CHLORIDE 0.9 % IR SOLN
Status: DC | PRN
Start: 2023-11-20 — End: 2023-11-20
  Administered 2023-11-20: 3000 mL via INTRAVESICAL

## 2023-11-20 MED ORDER — CHLORHEXIDINE GLUCONATE 0.12 % MT SOLN
15.0000 mL | Freq: Once | OROMUCOSAL | Status: AC
  Administered 2023-11-20: 15 mL via OROMUCOSAL

## 2023-11-20 MED ORDER — ONDANSETRON HCL 4 MG/2ML IJ SOLN
INTRAMUSCULAR | Status: AC
  Filled 2023-11-20: qty 2

## 2023-11-20 MED ORDER — FENTANYL CITRATE (PF) 100 MCG/2ML IJ SOLN
INTRAMUSCULAR | Status: DC | PRN
  Administered 2023-11-20: 25 ug via INTRAVENOUS

## 2023-11-20 MED ORDER — FENTANYL CITRATE PF 50 MCG/ML IJ SOSY: 25.0000 ug | PREFILLED_SYRINGE | INTRAMUSCULAR | Status: DC | PRN

## 2023-11-20 MED ORDER — PROPOFOL 10 MG/ML IV BOLUS
INTRAVENOUS | Status: DC | PRN
  Administered 2023-11-20: 140 mg via INTRAVENOUS

## 2023-11-20 SURGICAL SUPPLY — 18 items
BAG URO CATCHER STRL LF (MISCELLANEOUS) ×1 IMPLANT
BASKET ZERO TIP NITINOL 2.4FR (BASKET) IMPLANT
CATH URETL OPEN 5X70 (CATHETERS) ×1 IMPLANT
CLOTH BEACON ORANGE TIMEOUT ST (SAFETY) ×1 IMPLANT
DRSG TEGADERM 2-3/8X2-3/4 SM (GAUZE/BANDAGES/DRESSINGS) IMPLANT
FIBER LASER MOSES 200 DFL (Laser) IMPLANT
GLOVE BIO SURGEON STRL SZ 6.5 (GLOVE) ×1 IMPLANT
GOWN STRL REUS W/ TWL LRG LVL3 (GOWN DISPOSABLE) ×1 IMPLANT
GUIDEWIRE STR DUAL SENSOR (WIRE) ×1 IMPLANT
KIT TURNOVER KIT A (KITS) ×1 IMPLANT
MANIFOLD NEPTUNE II (INSTRUMENTS) ×1 IMPLANT
PACK CYSTO (CUSTOM PROCEDURE TRAY) ×1 IMPLANT
SHEATH NAVIGATOR HD 11/13X28 (SHEATH) IMPLANT
SHEATH NAVIGATOR HD 11/13X36 (SHEATH) IMPLANT
STENT URET 6FRX24 CONTOUR (STENTS) IMPLANT
TRACTIP FLEXIVA PULS ID 200XHI (Laser) IMPLANT
TUBING CONNECTING 10 (TUBING) ×1 IMPLANT
TUBING UROLOGY SET (TUBING) ×1 IMPLANT

## 2023-11-20 NOTE — Transfer of Care (Signed)
 Immediate Anesthesia Transfer of Care Note  Patient: Victoria Strickland  Procedure(s) Performed: CYSTOSCOPY/URETEROSCOPY/HOLMIUM LASER/STENT PLACEMENT (Right: Ureter)  Patient Location: PACU  Anesthesia Type:General  Level of Consciousness: awake, alert , and oriented  Airway & Oxygen Therapy: Patient Spontanous Breathing and Patient connected to face mask oxygen  Post-op Assessment: Report given to RN and Post -op Vital signs reviewed and stable  Post vital signs: Reviewed and stable  Last Vitals:  Vitals Value Taken Time  BP 151/76 11/20/23 08:15  Temp    Pulse 57 11/20/23 08:15  Resp 15 11/20/23 08:15  SpO2 100 % 11/20/23 08:15  Vitals shown include unfiled device data.  Last Pain:  Vitals:   11/20/23 0624  TempSrc:   PainSc: 0-No pain      Patients Stated Pain Goal: 4 (11/20/23 9375)  Complications: No notable events documented.

## 2023-11-20 NOTE — H&P (Signed)
 History of present illness: 63 yo woman with history of urolithaisis presented [facility] with fatigue and sepsis criteria in July 2025.  CT of the abdomen pelvis was performed which showed 6 mm obstructing right distal ureteral calculus.  She underwent right ureteral stent placement and now returns for definitive removal of ureteral stone.  Review of systems: A 12 point comprehensive review of systems was obtained and is negative unless otherwise stated in the history of present illness.  Patient Active Problem List   Diagnosis Date Noted   Anemia 11/17/2023   Rash 11/17/2023   Kidney stone 10/25/2023   Urinary tract infection with hematuria 10/25/2023   AKI (acute kidney injury) (HCC) 10/25/2023   Severe sepsis (HCC) 10/24/2023   Unintentional weight loss 10/06/2023   Routine general medical examination at health care facility 10/02/2023   Pure hypercholesterolemia 10/02/2023   Essential hypertension, benign 07/22/2018    No current facility-administered medications on file prior to encounter.   Current Outpatient Medications on File Prior to Encounter  Medication Sig Dispense Refill   Ascorbic Acid (VITAMIN C PO) Take 1 Dose by mouth in the morning. *liquid*     Black Cohosh (REMIFEMIN MENOPAUSE PO) Take 1 tablet by mouth in the morning and at bedtime.     Cholecalciferol (VITAMIN D -3 PO) Take 1 tablet by mouth in the morning.     Cyanocobalamin (VITAMIN B-12 PO) Take 5 mLs by mouth at bedtime. *liquid*     Multiple Vitamin (MULTIVITAMIN) LIQD Take 5 mLs by mouth daily.     spironolactone  (ALDACTONE ) 25 MG tablet TAKE 1 TABLET(25 MG) BY MOUTH DAILY 90 tablet 1   valACYclovir (VALTREX) 1000 MG tablet Take 1,000 mg by mouth 2 (two) times daily.      Past Medical History:  Diagnosis Date   COVID 05/2020   mild symptoms x few days all symptoms resolved   Essential hypertension, benign    Hematuria 08/27/2020   pt in er, issue resolved as of 10-07-2020   History of  kidney stones    Hot flashes 10/07/2020   Hyperlipidemia    Right renal stone 10/07/2020   Wears dentures 10/07/2020   upper   Wears glasses 10/07/2020    Past Surgical History:  Procedure Laterality Date   CYSTOSCOPY WITH RETROGRADE PYELOGRAM, URETEROSCOPY AND STENT PLACEMENT Right 10/12/2020   Procedure: CYSTOSCOPY WITH RETROGRADE PYELOGRAM, URETEROSCOPY AND STENT PLACEMENT AND LASER LITHOTRIPSY;  Surgeon: Elisabeth Valli BIRCH, MD;  Location: [facility];  Service: Urology;  Laterality: Right;  1 HR   CYSTOSCOPY WITH STENT PLACEMENT Right 10/24/2023   Procedure: CYSTOSCOPY, WITH STENT INSERTION;  Surgeon: Elisabeth Valli BIRCH, MD;  Location: [facility];  Service: Urology;  Laterality: Right;   TONSILLECTOMY  1973    Social History   Tobacco Use   Smoking status: Never    Passive exposure: Never   Smokeless tobacco: Never  Vaping Use   Vaping status: Never Used  Substance Use Topics   Alcohol use: Never   Drug use: Never    Family History  Problem Relation Age of Onset   Fibroids Mother    Cancer Mother        unknown   Aneurysm Father    Heart disease Maternal Grandmother    Breast cancer Cousin        maternal side   Cancer Other        maternal   Breast cancer Other    Stroke Other  PE: Vitals:   11/20/23 0555 11/20/23 0624  BP: 136/75   Pulse: 64   Resp: 17   Temp: 98.3 F (36.8 C)   TempSrc: Oral   SpO2: 100%   Weight:  68 kg  Height:  5' 4 (1.626 m)   Patient appears to be in no acute distress  patient is alert and oriented x3 Atraumatic normocephalic head No cervical or supraclavicular lymphadenopathy appreciated No increased work of breathing, no audible wheezes/rhonchi Regular sinus rhythm/rate Abdomen is soft, nontender, nondistended, no CVA or suprapubic tenderness Lower extremities are symmetric without appreciable edema Grossly neurologically intact No identifiable skin lesions  No results for input(s): WBC, HGB, HCT in  the last 72 hours. No results for input(s): NA, K, CL, CO2, GLUCOSE, BUN, CREATININE, CALCIUM  in the last 72 hours. No results for input(s): LABPT, INR in the last 72 hours. No results for input(s): LABURIN in the last 72 hours. Results for orders placed or performed during the hospital encounter of 10/24/23  Resp panel by RT-PCR (RSV, Flu A&B, Covid) Anterior Nasal Swab     Status: None   Collection Time: 10/24/23  4:24 PM   Specimen: Anterior Nasal Swab  Result Value Ref Range Status   SARS Coronavirus 2 by RT PCR NEGATIVE NEGATIVE Final    Comment: (NOTE) SARS-CoV-2 target nucleic acids are NOT DETECTED.  The SARS-CoV-2 RNA is generally detectable in upper respiratory specimens during the acute phase of infection. The lowest concentration of SARS-CoV-2 viral copies this assay can detect is 138 copies/mL. A negative result does not preclude SARS-Cov-2 infection and should not be used as the sole basis for treatment or other patient management decisions. A negative result may occur with  improper specimen collection/handling, submission of specimen other than nasopharyngeal swab, presence of viral mutation(s) within the areas targeted by this assay, and inadequate number of viral copies(<138 copies/mL). A negative result must be combined with clinical observations, patient history, and epidemiological information. The expected result is Negative.  Fact Sheet for Patients:  BloggerCourse.com  Fact Sheet for Healthcare Providers:  SeriousBroker.it  This test is no t yet approved or cleared by the [place]  FDA and  has been authorized for detection and/or diagnosis of SARS-CoV-2 by FDA under an Emergency Use Authorization (EUA). This EUA will remain  in effect (meaning this test can be used) for the duration of the COVID-19 declaration under Section 564(b)(1) of the Act, 21 U.S.C.section 360bbb-3(b)(1),  unless the authorization is terminated  or revoked sooner.       Influenza A by PCR NEGATIVE NEGATIVE Final   Influenza B by PCR NEGATIVE NEGATIVE Final    Comment: (NOTE) The Xpert Xpress SARS-CoV-2/FLU/RSV plus assay is intended as an aid in the diagnosis of influenza from Nasopharyngeal swab specimens and should not be used as a sole basis for treatment. Nasal washings and aspirates are unacceptable for Xpert Xpress SARS-CoV-2/FLU/RSV testing.  Fact Sheet for Patients: BloggerCourse.com  Fact Sheet for Healthcare Providers: SeriousBroker.it  This test is not yet approved or cleared by the [place]  FDA and has been authorized for detection and/or diagnosis of SARS-CoV-2 by FDA under an Emergency Use Authorization (EUA). This EUA will remain in effect (meaning this test can be used) for the duration of the COVID-19 declaration under Section 564(b)(1) of the Act, 21 U.S.C. section 360bbb-3(b)(1), unless the authorization is terminated or revoked.     Resp Syncytial Virus by PCR NEGATIVE NEGATIVE Final    Comment: (NOTE) Fact Sheet for  Patients: BloggerCourse.com  Fact Sheet for Healthcare Providers: SeriousBroker.it  This test is not yet approved or cleared by the [place]  FDA and has been authorized for detection and/or diagnosis of SARS-CoV-2 by FDA under an Emergency Use Authorization (EUA). This EUA will remain in effect (meaning this test can be used) for the duration of the COVID-19 declaration under Section 564(b)(1) of the Act, 21 U.S.C. section 360bbb-3(b)(1), unless the authorization is terminated or revoked.  Performed at [facility], [address], [place], [place] [address]   Blood culture (routine x 2)     Status: None   Collection Time: 10/24/23  5:09 PM   Specimen: BLOOD  Result Value Ref Range Status   Specimen  Description   Final    BLOOD LEFT ANTECUBITAL Performed at [facility], [address] [place], [place] [address]    Special Requests   Final    BOTTLES DRAWN AEROBIC AND ANAEROBIC Blood Culture adequate volume Performed at [facility], [address], [place], [place] [address]    Culture   Final    NO GROWTH 5 DAYS Performed at [facility], [address] [place], [place] [address]    Report Status 10/30/2023 FINAL  Final  Blood culture (routine x 2)     Status: None   Collection Time: 10/24/23  5:14 PM   Specimen: BLOOD  Result Value Ref Range Status   Specimen Description   Final    BLOOD RIGHT ANTECUBITAL Performed at [facility], [address] [place], [place] [address]    Special Requests   Final    BOTTLES DRAWN AEROBIC AND ANAEROBIC Blood Culture adequate volume Performed at [facility], [address] [place], [place] [address]    Culture   Final    NO GROWTH 5 DAYS Performed at [facility], [address] [place], [place] [address]    Report Status 10/30/2023 FINAL  Final  Urine Culture     Status: Abnormal   Collection Time: 10/24/23  6:15 PM   Specimen: Urine, Random  Result Value Ref Range Status   Specimen Description   Final    URINE, RANDOM Performed at [facility], [address], [place], [place] [address]    Special Requests   Final    NONE Reflexed from [id number] Performed at [facility], [address] [place], [place] [address]    Culture >=100,000 COLONIES/mL ENTEROCOCCUS FAECALIS (A)  Final   Report Status 10/26/2023 FINAL  Final   Organism ID, Bacteria ENTEROCOCCUS FAECALIS (A)  Final      Susceptibility   Enterococcus faecalis - MIC*    AMPICILLIN <=2 SENSITIVE Sensitive     NITROFURANTOIN <=16 SENSITIVE Sensitive     VANCOMYCIN  1 SENSITIVE Sensitive     * >=100,000 COLONIES/mL ENTEROCOCCUS FAECALIS   MRSA Next Gen by PCR, Nasal     Status: None   Collection Time: 10/24/23 11:38 PM   Specimen: Nasal Mucosa; Nasal Swab  Result Value Ref Range Status   MRSA by PCR Next Gen NOT DETECTED NOT DETECTED Final    Comment: (NOTE) The GeneXpert MRSA Assay (FDA approved for NASAL specimens only), is one component of a comprehensive MRSA colonization surveillance program. It is not intended to diagnose MRSA infection nor to guide or monitor treatment for MRSA infections. Test performance is not FDA approved in patients less than 31 years old. Performed at [facility], [address] [place], [place] [address]   Respiratory (~20 pathogens) panel by PCR     Status: None   Collection Time: 10/25/23  6:43 AM   Specimen: Nasopharyngeal Swab; Respiratory  Result Value Ref Range Status   Adenovirus NOT DETECTED NOT DETECTED Final   Coronavirus 229E NOT DETECTED NOT DETECTED Final    Comment: (NOTE) The Coronavirus on the Respiratory Panel, DOES NOT test for the novel  Coronavirus (2019 nCoV)    Coronavirus HKU1 NOT DETECTED NOT DETECTED Final   Coronavirus NL63 NOT DETECTED NOT DETECTED Final   Coronavirus OC43 NOT DETECTED NOT DETECTED Final   Metapneumovirus NOT DETECTED NOT DETECTED Final   Rhinovirus / Enterovirus NOT DETECTED NOT DETECTED Final   Influenza A NOT DETECTED NOT DETECTED Final   Influenza B NOT DETECTED NOT DETECTED Final   Parainfluenza Virus 1 NOT DETECTED NOT DETECTED Final   Parainfluenza Virus 2 NOT DETECTED NOT DETECTED Final   Parainfluenza Virus 3 NOT DETECTED NOT DETECTED Final   Parainfluenza Virus 4 NOT DETECTED NOT DETECTED Final   Respiratory Syncytial Virus NOT DETECTED NOT DETECTED Final   Bordetella pertussis NOT DETECTED NOT DETECTED Final   Bordetella Parapertussis NOT DETECTED NOT DETECTED Final   Chlamydophila pneumoniae NOT DETECTED NOT DETECTED Final   Mycoplasma pneumoniae NOT DETECTED NOT DETECTED Final    Comment: Performed at  [facility], [address], [place], [place] [address]    Imaging: CT Abd/Pelvis 10/24/23 IMPRESSION: 1. No acute intrathoracic abnormality. 2. Obstructive 6 mm distal ureterolithiasis with associated urothelial thickening-correlate for superimposed infection with urinalysis. 3. Nonobstructive bilateral nephrolithiasis measuring up to 2 mm. 4.  Aortic Atherosclerosis (ICD10-I70.0).     Electronically Signed   By: Morgane  Naveau M.D.   On: 10/24/2023 18:24  A/P: Urolithiasis: - Patient previously underwent right ureteral stent placement for infected obstructing 6 mm right ureteral stone - She now returns for definitive stone removal - She understands that she may have a stent again following this procedure - Risks and benefits discussed and she has undergone this procedure previously  Surgery scheduled for today    Shanon Seawright D Jeananne Bedwell

## 2023-11-20 NOTE — Anesthesia Postprocedure Evaluation (Signed)
 Anesthesia Post Note  Patient: Nakota Ackert  Procedure(s) Performed: CYSTOSCOPY/URETEROSCOPY/HOLMIUM LASER/STENT PLACEMENT (Right: Ureter)     Patient location during evaluation: PACU Anesthesia Type: General Level of consciousness: awake and alert Pain management: pain level controlled Vital Signs Assessment: post-procedure vital signs reviewed and stable Respiratory status: spontaneous breathing, nonlabored ventilation and respiratory function stable Cardiovascular status: blood pressure returned to baseline and stable Postop Assessment: no apparent nausea or vomiting Anesthetic complications: no   No notable events documented.  Last Vitals:  Vitals:   11/20/23 0830 11/20/23 0845  BP: (!) 147/75 (!) 146/74  Pulse: (!) 59 (!) 56  Resp: 18 15  Temp:    SpO2: 100% 100%    Last Pain:  Vitals:   11/20/23 0845  TempSrc:   PainSc: 0-No pain                 Kirstie Larsen

## 2023-11-20 NOTE — Anesthesia Preprocedure Evaluation (Addendum)
 Anesthesia Evaluation  Patient identified by MRN, date of birth, ID band Patient awake    Reviewed: Allergy & Precautions, NPO status , Patient's Chart, lab work & pertinent test results  History of Anesthesia Complications Negative for: history of anesthetic complications  Airway Mallampati: II  TM Distance: >3 FB     Dental  (+) Edentulous Upper, Dental Advisory Given, Missing,    Pulmonary neg shortness of breath, neg sleep apnea, neg COPD, neg recent URI   breath sounds clear to auscultation       Cardiovascular hypertension, Pt. on medications (-) angina (-) Past MI and (-) CHF (-) dysrhythmias  Rhythm:Regular     Neuro/Psych negative neurological ROS  negative psych ROS   GI/Hepatic negative GI ROS, Neg liver ROS,,,  Endo/Other  negative endocrine ROS    Renal/GU Renal InsufficiencyRenal diseaseLab Results      Component                Value               Date                      NA                       137                 11/06/2023                K                        3.9                 11/06/2023                CO2                      22                  11/06/2023                GLUCOSE                  87                  11/06/2023                BUN                      12                  11/06/2023                CREATININE               1.11 (H)            11/06/2023                CALCIUM                   9.6                 11/06/2023                EGFR                     56 (L)  11/06/2023                GFRNONAA                 41 (L)              10/26/2023                Musculoskeletal negative musculoskeletal ROS (+)    Abdominal   Peds  Hematology  (+) Blood dyscrasia, anemia Lab Results      Component                Value               Date                      WBC                      9.8                 11/06/2023                HGB                      9.0 (L)              11/06/2023                HCT                      29.8 (L)            11/06/2023                MCV                      84                  11/06/2023                PLT                      288                 11/06/2023              Anesthesia Other Findings   Reproductive/Obstetrics                              Anesthesia Physical Anesthesia Plan  ASA: 3  Anesthesia Plan: General   Post-op Pain Management: Minimal or no pain anticipated   Induction: Intravenous  PONV Risk Score and Plan: 3 and Ondansetron , Dexamethasone  and Midazolam   Airway Management Planned: Oral ETT and LMA  Additional Equipment: None  Intra-op Plan:   Post-operative Plan: Extubation in OR  Informed Consent: I have reviewed the patients History and Physical, chart, labs and discussed the procedure including the risks, benefits and alternatives for the proposed anesthesia with the patient or authorized representative who has indicated his/her understanding and acceptance.     Dental advisory given  Plan Discussed with: CRNA  Anesthesia Plan Comments:          Anesthesia Quick Evaluation

## 2023-11-20 NOTE — Op Note (Signed)
 Preoperative diagnosis: right ureteral calculus  Postoperative diagnosis: right ureteral calculus  Procedure:  Cystoscopy right ureteroscopy, laser lithotripsy, basket stone extraction right 74F x 24cm ureteral stent exchange - no string   Surgeon: Valli Shank, MD  Anesthesia: General  Complications: None  Intraoperative findings:  Normal urethra Bilateral orthotropic ureteral orifices Bladder mucosa normal without masses  Impacted right ureteral calculus  EBL: Minimal  Specimens: right ureteral calculus  Disposition of specimens: [facility] for stone analysis  Indication: Victoria Strickland is a 63 y.o.   patient with a obstructing 6mm right ureteral stone who previously underwent right ureteral stent placement for UTI/sepsis and now returns for definitive stone removal. After reviewing the management options for treatment, the patient elected to proceed with the above surgical procedure(s). We have discussed the potential benefits and risks of the procedure, side effects of the proposed treatment, the likelihood of the patient achieving the goals of the procedure, and any potential problems that might occur during the procedure or recuperation. Informed consent has been obtained.   Description of procedure:  The patient was taken to the operating room and general anesthesia was induced.  The patient was placed in the dorsal lithotomy position, prepped and draped in the usual sterile fashion, and preoperative antibiotics were administered. A preoperative time-out was performed.   Cystourethroscopy was performed.  The patient's urethra was examined and was normal.  The bladder was then systematically examined in its entirety. There was no evidence for any bladder tumors, stones, or other mucosal pathology.    Attention then turned to the right ureteral orifice and the existing right ureteral stent was grasped with a grasper and brought to the urethral  meatus.  A 0.38 sensor wire was advanced through the ureteral stent and into the ureter with fluoroscopic guidance.  The stent was removed. The 6 Fr semirigid ureteroscope was then advanced into the ureter next to the guidewire and the calculus was identified.  The stone was noted to be impacted with edematous mucosa surrounding it.   The stone was then fragmented with the 242 micron holmium laser fiber. All stones were then removed from the ureter with a 0 tip basket.  Reinspection of the ureter revealed no remaining visible stones or fragments.   The wire was then backloaded through the cystoscope and a ureteral stent was advance over the wire using Seldinger technique.  The stent was positioned appropriately under fluoroscopic and cystoscopic guidance.  The wire was then removed with an adequate stent curl noted in the renal pelvis as well as in the bladder.  The bladder was then emptied and the procedure ended.  The patient appeared to tolerate the procedure well and without complications.  The patient was able to be awakened and transferred to the recovery unit in satisfactory condition.   Disposition: Stent to be left in place for approximately 2 weeks.

## 2023-11-20 NOTE — Discharge Instructions (Addendum)
 DISCHARGE INSTRUCTIONS FOR KIDNEY STONE/URETERAL STENT   MEDICATIONS:  1. Resume all your other meds from home  2. AZO over the counter can help with the burning/stinging when you urinate. 3. Tramadol  is for moderate/severe pain, otherwise taking up to 1000 mg every 6 hours of plainTylenol will help treat your pain.    ACTIVITY:  1. No strenuous activity x 1week  2. No driving while on narcotic pain medications  3. Drink plenty of water  4. Continue to walk at home - you can still get blood clots when you are at home, so keep active, but don't over do it.  5. May return to work/school tomorrow or when you feel ready   BATHING:  1. You can shower and we recommend daily showers   SIGNS/SYMPTOMS TO CALL:  Please call us  if you have a fever greater than 101.5, uncontrolled nausea/vomiting, uncontrolled pain, dizziness, unable to urinate, bloody urine, chest pain, shortness of breath, leg swelling, leg pain, redness around wound, drainage from wound, or any other concerns or questions.   You can reach us  at [phone].   FOLLOW-UP:  1. You have an appointment on 9/17 at 2:30pm. We will contact you for stent removal in approx 2 weeks.

## 2023-11-20 NOTE — Anesthesia Procedure Notes (Addendum)
 Procedure Name: LMA Insertion Date/Time: 11/20/2023 7:40 AM  Performed by: Joshua Vernell BROCKS, CRNAPre-anesthesia Checklist: Patient identified, Emergency Drugs available, Suction available and Patient being monitored Patient Re-evaluated:Patient Re-evaluated prior to induction Oxygen Delivery Method: Circle system utilized Preoxygenation: Pre-oxygenation with 100% oxygen Induction Type: IV induction Ventilation: Mask ventilation without difficulty LMA: LMA inserted LMA Size: 4.0 Number of attempts: 1 Placement Confirmation: positive ETCO2 and breath sounds checked- equal and bilateral Tube secured with: Tape Dental Injury: Teeth and Oropharynx as per pre-operative assessment

## 2023-11-21 ENCOUNTER — Encounter (HOSPITAL_COMMUNITY): Payer: Self-pay | Admitting: Urology

## 2023-11-29 ENCOUNTER — Ambulatory Visit
Admission: RE | Admit: 2023-11-29 | Discharge: 2023-11-29 | Disposition: A | Discharge status: Home / Self Care | Source: Ambulatory Visit | Attending: Internal Medicine | Admitting: Internal Medicine

## 2023-11-29 DIAGNOSIS — Z1231 Encounter for screening mammogram for malignant neoplasm of breast: Secondary | ICD-10-CM

## 2023-12-17 ENCOUNTER — Other Ambulatory Visit (HOSPITAL_BASED_OUTPATIENT_CLINIC_OR_DEPARTMENT_OTHER): Payer: Self-pay | Admitting: Cardiovascular Disease

## 2023-12-18 ENCOUNTER — Encounter: Payer: Self-pay | Admitting: Internal Medicine

## 2024-03-31 ENCOUNTER — Encounter: Payer: Self-pay | Admitting: Internal Medicine

## 2024-04-07 ENCOUNTER — Ambulatory Visit: Payer: Self-pay | Admitting: Internal Medicine

## 2024-04-09 ENCOUNTER — Encounter: Payer: Self-pay | Admitting: Internal Medicine

## 2024-05-07 ENCOUNTER — Ambulatory Visit: Payer: Self-pay | Admitting: Internal Medicine

## 2024-05-07 VITALS — BP 126/80 | HR 63 | Temp 98.1°F | Ht 64.0 in | Wt 160.6 lb

## 2024-05-07 DIAGNOSIS — Z87440 Personal history of urinary (tract) infections: Secondary | ICD-10-CM

## 2024-05-07 DIAGNOSIS — E7800 Pure hypercholesterolemia, unspecified: Secondary | ICD-10-CM

## 2024-05-07 DIAGNOSIS — D649 Anemia, unspecified: Secondary | ICD-10-CM

## 2024-05-07 DIAGNOSIS — I10 Essential (primary) hypertension: Secondary | ICD-10-CM

## 2024-05-07 LAB — URINALYSIS, COMPLETE
Bilirubin, UA: NEGATIVE
Glucose, UA: NEGATIVE
Ketones, UA: NEGATIVE
Leukocytes,UA: NEGATIVE
Nitrite, UA: NEGATIVE
Protein,UA: NEGATIVE
RBC, UA: NEGATIVE
Specific Gravity, UA: 1.006 (ref 1.005–1.030)
Urobilinogen, Ur: 0.2 mg/dL (ref 0.2–1.0)
pH, UA: 6.5 (ref 5.0–7.5)

## 2024-05-07 LAB — MICROSCOPIC EXAMINATION
Bacteria, UA: NONE SEEN
Casts: NONE SEEN /LPF
Epithelial Cells (non renal): NONE SEEN /HPF (ref 0–10)
RBC, Urine: NONE SEEN /HPF (ref 0–2)
WBC, UA: NONE SEEN /HPF (ref 0–5)

## 2024-05-07 NOTE — Patient Instructions (Signed)
 Hypertension, Adult Hypertension is another name for high blood pressure. High blood pressure forces your heart to work harder to pump blood. This can cause problems over time. There are two numbers in a blood pressure reading. There is a top number (systolic) over a bottom number (diastolic). It is best to have a blood pressure that is below 120/80. What are the causes? The cause of this condition is not known. Some other conditions can lead to high blood pressure. What increases the risk? Some lifestyle factors can make you more likely to develop high blood pressure: Smoking. Not getting enough exercise or physical activity. Being overweight. Having too much fat, sugar, calories, or salt (sodium) in your diet. Drinking too much alcohol. Other risk factors include: Having any of these conditions: Heart disease. Diabetes. High cholesterol. Kidney disease. Obstructive sleep apnea. Having a family history of high blood pressure and high cholesterol. Age. The risk increases with age. Stress. What are the signs or symptoms? High blood pressure may not cause symptoms. Very high blood pressure (hypertensive crisis) may cause: Headache. Fast or uneven heartbeats (palpitations). Shortness of breath. Nosebleed. Vomiting or feeling like you may vomit (nauseous). Changes in how you see. Very bad chest pain. Feeling dizzy. Seizures. How is this treated? This condition is treated by making healthy lifestyle changes, such as: Eating healthy foods. Exercising more. Drinking less alcohol. Your doctor may prescribe medicine if lifestyle changes do not help enough and if: Your top number is above 130. Your bottom number is above 80. Your personal target blood pressure may vary. Follow these instructions at home: Eating and drinking  If told, follow the DASH eating plan. To follow this plan: Fill one half of your plate at each meal with fruits and vegetables. Fill one fourth of your plate  at each meal with whole grains. Whole grains include whole-wheat pasta, brown rice, and whole-grain bread. Eat or drink low-fat dairy products, such as skim milk or low-fat yogurt. Fill one fourth of your plate at each meal with low-fat (lean) proteins. Low-fat proteins include fish, chicken without skin, eggs, beans, and tofu. Avoid fatty meat, cured and processed meat, or chicken with skin. Avoid pre-made or processed food. Limit the amount of salt in your diet to less than 1,500 mg each day. Do not drink alcohol if: Your doctor tells you not to drink. You are pregnant, may be pregnant, or are planning to become pregnant. If you drink alcohol: Limit how much you have to: 0-1 drink a day for women. 0-2 drinks a day for men. Know how much alcohol is in your drink. In the [place] one drink equals one 12 oz bottle of beer (355 mL), one 5 oz glass of wine (148 mL), or one 1 oz glass of hard liquor (44 mL). Lifestyle  Work with your doctor to stay at a healthy weight or to lose weight. Ask your doctor what the best weight is for you. Get at least 30 minutes of exercise that causes your heart to beat faster (aerobic exercise) most days of the week. This may include walking, swimming, or biking. Get at least 30 minutes of exercise that strengthens your muscles (resistance exercise) at least 3 days a week. This may include lifting weights or doing Pilates. Do not smoke or use any products that contain nicotine or tobacco. If you need help quitting, ask your doctor. Check your blood pressure at home as told by your doctor. Keep all follow-up visits. Medicines Take over-the-counter and prescription medicines  only as told by your doctor. Follow directions carefully. Do not skip doses of blood pressure medicine. The medicine does not work as well if you skip doses. Skipping doses also puts you at risk for problems. Ask your doctor about side effects or reactions to medicines that you should watch  for. Contact a doctor if: You think you are having a reaction to the medicine you are taking. You have headaches that keep coming back. You feel dizzy. You have swelling in your ankles. You have trouble with your vision. Get help right away if: You get a very bad headache. You start to feel mixed up (confused). You feel weak or numb. You feel faint. You have very bad pain in your: Chest. Belly (abdomen). You vomit more than once. You have trouble breathing. These symptoms may be an emergency. Get help right away. Call 911. Do not wait to see if the symptoms will go away. Do not drive yourself to the hospital. Summary Hypertension is another name for high blood pressure. High blood pressure forces your heart to work harder to pump blood. For most people, a normal blood pressure is less than 120/80. Making healthy choices can help lower blood pressure. If your blood pressure does not get lower with healthy choices, you may need to take medicine. This information is not intended to replace advice given to you by your health care provider. Make sure you discuss any questions you have with your health care provider. Document Revised: 01/06/2021 Document Reviewed: 01/06/2021 Elsevier Patient Education  2024 [employer].

## 2024-05-07 NOTE — Progress Notes (Unsigned)
 I,Victoria T Emmitt, CMA,acting as a neurosurgeon for Catheryn LOISE Slocumb, MD.,have documented all relevant documentation on the behalf of Catheryn LOISE Slocumb, MD,as directed by  Catheryn LOISE Slocumb, MD while in the presence of Catheryn LOISE Slocumb, MD.  Subjective:  Patient ID: Victoria Strickland , female    DOB: July 22, 1960 , 64 y.o.   MRN: [id number]  Chief Complaint  Patient presents with   Hypertension   Hyperlipidemia    HPI Discussed the use of AI scribe software for clinical note transcription with the patient, who gave verbal consent to proceed.  History of Present Illness Victoria Strickland is a 63 year old female who presents for a blood pressure check.  She feels well today with no concerns. She is not on any other prescription medications but takes a multivitamin and vitamin D .  In August, she was hospitalized for dehydration and discontinued Repatha , which she has not resumed. She is not on any cholesterol medication and prefers to avoid additional medications if her cholesterol levels are satisfactory.  She had a bladder infection related to a stone, which was removed by a urologist. She has not had any follow-up since the stone removal and reports no current symptoms of a urinary tract infection. Her urine is light yellow, and she is unsure about the status of the bacteria without further testing.  She exercises regularly, doing a morning 15-minute routine and walking in the evenings. Her weight has been decreasing, although she has not weighed herself recently. Her water intake is described as 'pretty good'.  No current symptoms of a urinary tract infection, such as chills or fever.   HPI   Past Medical History:  Diagnosis Date   COVID 05/2020   mild symptoms x few days all symptoms resolved   Essential hypertension, benign    Hematuria 08/27/2020   pt in er, issue resolved as of 10-07-2020   History of kidney stones    Hot flashes 10/07/2020   Hyperlipidemia    Right renal stone  10/07/2020   Wears dentures 10/07/2020   upper   Wears glasses 10/07/2020     Family History  Problem Relation Age of Onset   Fibroids Mother    Cancer Mother        unknown   Aneurysm Father    Heart disease Maternal Grandmother    Breast cancer Cousin        maternal side   Cancer Other        maternal   Breast cancer Other    Stroke Other      Current Outpatient Medications:    Cholecalciferol (VITAMIN D -3 PO), Take 1 tablet by mouth in the morning., Disp: , Rfl:    Cyanocobalamin (VITAMIN B-12 PO), Take 5 mLs by mouth at bedtime. *liquid*, Disp: , Rfl:    Multiple Vitamin (MULTIVITAMIN) LIQD, Take 5 mLs by mouth daily., Disp: , Rfl:    spironolactone  (ALDACTONE ) 25 MG tablet, TAKE 1 TABLET(25 MG) BY MOUTH DAILY, Disp: 90 tablet, Rfl: 1   Allergies  Allergen Reactions   Atorvastatin  Other (See Comments)    Memory loss     Review of Systems  Constitutional: Negative.   Respiratory: Negative.    Cardiovascular: Negative.   Neurological: Negative.   Psychiatric/Behavioral: Negative.       Today's Vitals   05/07/24 1549  BP: 126/80  Pulse: 63  Temp: 98.1 F (36.7 C)  SpO2: 98%  Weight: 160 lb 9.6 oz (72.8 kg)  Height: 5'  4 (1.626 m)   Body mass index is 27.57 kg/m.  Wt Readings from Last 3 Encounters:  05/07/24 160 lb 9.6 oz (72.8 kg)  11/20/23 150 lb (68 kg)  11/06/23 154 lb (69.9 kg)    The ASCVD Risk score (Arnett DK, et al., 2019) failed to calculate for the following reasons:   The valid total cholesterol range is 130 to 320 mg/dL  Objective:  Physical Exam Vitals and nursing note reviewed.  Constitutional:      Appearance: Normal appearance.  HENT:     Head: Normocephalic and atraumatic.  Cardiovascular:     Rate and Rhythm: Normal rate and regular rhythm.     Heart sounds: Normal heart sounds.  Pulmonary:     Effort: Pulmonary effort is normal.     Breath sounds: Normal breath sounds.  Skin:    General: Skin is warm.  Neurological:      General: No focal deficit present.     Mental Status: She is alert.  Psychiatric:        Mood and Affect: Mood normal.        Behavior: Behavior normal.         Assessment And Plan:   Assessment & Plan Essential hypertension, benign  Pure hypercholesterolemia  Anemia, unspecified type  History of UTI   Assessment & Plan Essential hypertension Blood pressure well-controlled on spironolactone . - Continue spironolactone  25 mg oral daily.  Anemia Previous low blood count noted in August. Current status unknown. - Ordered blood count and iron panel to assess current status.  Pure hypercholesterolemia Cholesterol management not addressed with medication. She prefers to avoid medication if levels are good. - Ordered cholesterol test to assess current levels.  History of urinary tract infection and nephrolithiasis Previous UTI and nephrolithiasis with stone removal. No current UTI symptoms. - Ordered urinalysis to check for signs of infection.  General health maintenance Routine health maintenance discussed, including exercise and vitamin supplementation. - Continue current exercise regimen. - Continue multivitamin, vitamin D , and vitamin B12 supplementation.   No orders of the defined types were placed in this encounter.    Return if symptoms worsen or fail to improve.  Patient was given opportunity to ask questions. Patient verbalized understanding of the plan and was able to repeat key elements of the plan. All questions were answered to their satisfaction.   I, Catheryn LOISE Slocumb, MD, have reviewed all documentation for this visit. The documentation on 05/07/24 for the exam, diagnosis, procedures, and orders are all accurate and complete.   IF YOU HAVE BEEN REFERRED TO A SPECIALIST, IT MAY TAKE 1-2 WEEKS TO SCHEDULE/PROCESS THE REFERRAL. IF YOU HAVE NOT HEARD FROM US /SPECIALIST IN TWO WEEKS, PLEASE GIVE US  A CALL AT [phone].

## 2024-05-08 ENCOUNTER — Encounter (HOSPITAL_BASED_OUTPATIENT_CLINIC_OR_DEPARTMENT_OTHER): Payer: Self-pay | Admitting: Cardiovascular Disease

## 2024-05-08 LAB — LIPID PANEL
Chol/HDL Ratio: 7.1 ratio — ABNORMAL HIGH (ref 0.0–4.4)
Cholesterol, Total: 271 mg/dL — ABNORMAL HIGH (ref 100–199)
HDL: 38 mg/dL — ABNORMAL LOW
LDL Chol Calc (NIH): 180 mg/dL — ABNORMAL HIGH (ref 0–99)
Triglycerides: 277 mg/dL — ABNORMAL HIGH (ref 0–149)
VLDL Cholesterol Cal: 53 mg/dL — ABNORMAL HIGH (ref 5–40)

## 2024-05-08 LAB — CBC
Hematocrit: 36.5 % (ref 34.0–46.6)
Hemoglobin: 11.3 g/dL (ref 11.1–15.9)
MCH: 25.2 pg — ABNORMAL LOW (ref 26.6–33.0)
MCHC: 31 g/dL — ABNORMAL LOW (ref 31.5–35.7)
MCV: 81 fL (ref 79–97)
Platelets: 234 10*3/uL (ref 150–450)
RBC: 4.49 x10E6/uL (ref 3.77–5.28)
RDW: 13.7 % (ref 11.7–15.4)
WBC: 5.1 10*3/uL (ref 3.4–10.8)

## 2024-05-08 LAB — CMP14+EGFR
ALT: 13 [IU]/L (ref 0–32)
AST: 23 [IU]/L (ref 0–40)
Albumin: 4.5 g/dL (ref 3.9–4.9)
Alkaline Phosphatase: 68 [IU]/L (ref 49–135)
BUN/Creatinine Ratio: 11 — ABNORMAL LOW (ref 12–28)
BUN: 14 mg/dL (ref 8–27)
Bilirubin Total: 0.2 mg/dL (ref 0.0–1.2)
CO2: 22 mmol/L (ref 20–29)
Calcium: 10.2 mg/dL (ref 8.7–10.3)
Chloride: 106 mmol/L (ref 96–106)
Creatinine, Ser: 1.24 mg/dL — ABNORMAL HIGH (ref 0.57–1.00)
Globulin, Total: 3 g/dL (ref 1.5–4.5)
Glucose: 86 mg/dL (ref 70–99)
Potassium: 4.3 mmol/L (ref 3.5–5.2)
Sodium: 145 mmol/L — ABNORMAL HIGH (ref 134–144)
Total Protein: 7.5 g/dL (ref 6.0–8.5)
eGFR: 49 mL/min/{1.73_m2} — ABNORMAL LOW

## 2024-05-08 LAB — IRON,TIBC AND FERRITIN PANEL
Ferritin: 304 ng/mL — ABNORMAL HIGH (ref 15–150)
Iron Saturation: 23 % (ref 15–55)
Iron: 71 ug/dL (ref 27–139)
Total Iron Binding Capacity: 312 ug/dL (ref 250–450)
UIBC: 241 ug/dL (ref 118–369)

## 2024-05-08 NOTE — Telephone Encounter (Signed)
 Chart reviewed.  Patient hospitalized w severe sepsis, acute kidney injury, discharged 10/27/23.  The dc instructions include stop Repatha .  Per chart notes though she had requested to stop it at the beginning of July.  Last seen March 2025 in cardiology.  Labs done at PCP yesterday.  LDL went from 45 to 180.  Triglycerides also elevated 277.  Will route to Dr. Raford for recommendations and scheduling to help get patient set up with overdue visit.

## 2024-05-09 NOTE — Assessment & Plan Note (Signed)
 Cholesterol management not addressed with medication at this time. Previously on Repatha . She prefers to avoid medication if levels are good. - Ordered cholesterol test to assess current levels.

## 2024-05-09 NOTE — Assessment & Plan Note (Signed)
 Blood pressure well-controlled on spironolactone . - Continue spironolactone  25 mg oral daily. - Follow low sodium diet.

## 2024-05-09 NOTE — Assessment & Plan Note (Signed)
 Previous low blood count noted in August. Current status unknown. - Ordered blood count and iron panel to assess current status.

## 2024-10-06 ENCOUNTER — Encounter: Payer: Self-pay | Admitting: Internal Medicine
